# Patient Record
Sex: Female | Born: 1970 | Race: White | Hispanic: No | Marital: Married | Smoking: Former smoker
Health system: Southern US, Community
[De-identification: ages and names within clinical notes are randomized; demographics above are authoritative.]

## PROBLEM LIST (undated history)

## (undated) DIAGNOSIS — K635 Polyp of colon: Secondary | ICD-10-CM

## (undated) DIAGNOSIS — E539 Vitamin B deficiency, unspecified: Secondary | ICD-10-CM

## (undated) DIAGNOSIS — K219 Gastro-esophageal reflux disease without esophagitis: Secondary | ICD-10-CM

## (undated) DIAGNOSIS — E559 Vitamin D deficiency, unspecified: Secondary | ICD-10-CM

## (undated) DIAGNOSIS — R42 Dizziness and giddiness: Secondary | ICD-10-CM

## (undated) HISTORY — DX: Dizziness and giddiness: R42

## (undated) HISTORY — DX: Vitamin D deficiency, unspecified: E55.9

## (undated) HISTORY — DX: Gastro-esophageal reflux disease without esophagitis: K21.9

## (undated) HISTORY — DX: Polyp of colon: K63.5

## (undated) HISTORY — DX: Vitamin B deficiency, unspecified: E53.9

---
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
## 1991-12-01 HISTORY — PX: OVARIAN CYST REMOVAL: SHX89

## 1996-11-30 HISTORY — PX: HERNIA REPAIR: SHX51

## 1998-08-19 ENCOUNTER — Other Ambulatory Visit: Admission: RE | Admit: 1998-08-19 | Discharge: 1998-08-19 | Payer: Self-pay | Admitting: Obstetrics and Gynecology

## 1998-08-23 ENCOUNTER — Ambulatory Visit (HOSPITAL_BASED_OUTPATIENT_CLINIC_OR_DEPARTMENT_OTHER): Admission: RE | Admit: 1998-08-23 | Discharge: 1998-08-23 | Payer: Self-pay | Admitting: Surgery

## 1999-10-08 ENCOUNTER — Other Ambulatory Visit: Admission: RE | Admit: 1999-10-08 | Discharge: 1999-10-08 | Payer: Self-pay | Admitting: Obstetrics and Gynecology

## 2000-12-17 ENCOUNTER — Other Ambulatory Visit: Admission: RE | Admit: 2000-12-17 | Discharge: 2000-12-17 | Payer: Self-pay | Admitting: Obstetrics and Gynecology

## 2001-08-26 ENCOUNTER — Encounter: Payer: Self-pay | Admitting: Emergency Medicine

## 2001-08-27 ENCOUNTER — Inpatient Hospital Stay (HOSPITAL_COMMUNITY): Admission: AD | Admit: 2001-08-27 | Discharge: 2001-08-28 | Payer: Self-pay | Admitting: Obstetrics and Gynecology

## 2001-08-27 ENCOUNTER — Encounter: Payer: Self-pay | Admitting: Obstetrics and Gynecology

## 2001-08-29 ENCOUNTER — Encounter: Payer: Self-pay | Admitting: Obstetrics and Gynecology

## 2001-08-29 ENCOUNTER — Encounter (INDEPENDENT_AMBULATORY_CARE_PROVIDER_SITE_OTHER): Payer: Self-pay | Admitting: *Deleted

## 2001-08-29 ENCOUNTER — Inpatient Hospital Stay (HOSPITAL_COMMUNITY): Admission: AD | Admit: 2001-08-29 | Discharge: 2001-09-01 | Payer: Self-pay | Admitting: Obstetrics and Gynecology

## 2001-08-31 ENCOUNTER — Encounter: Payer: Self-pay | Admitting: *Deleted

## 2001-10-05 ENCOUNTER — Other Ambulatory Visit: Admission: RE | Admit: 2001-10-05 | Discharge: 2001-10-05 | Payer: Self-pay | Admitting: Obstetrics and Gynecology

## 2003-06-13 ENCOUNTER — Other Ambulatory Visit: Admission: RE | Admit: 2003-06-13 | Discharge: 2003-06-13 | Payer: Self-pay | Admitting: Obstetrics and Gynecology

## 2003-07-20 ENCOUNTER — Encounter: Payer: Self-pay | Admitting: Obstetrics and Gynecology

## 2003-07-20 ENCOUNTER — Encounter: Admission: RE | Admit: 2003-07-20 | Discharge: 2003-07-20 | Payer: Self-pay | Admitting: Obstetrics and Gynecology

## 2004-03-28 ENCOUNTER — Emergency Department (HOSPITAL_COMMUNITY): Admission: EM | Admit: 2004-03-28 | Discharge: 2004-03-28 | Payer: Self-pay | Admitting: Family Medicine

## 2004-07-02 ENCOUNTER — Other Ambulatory Visit: Admission: RE | Admit: 2004-07-02 | Discharge: 2004-07-02 | Payer: Self-pay | Admitting: Obstetrics and Gynecology

## 2005-05-30 ENCOUNTER — Emergency Department (HOSPITAL_COMMUNITY): Admission: EM | Admit: 2005-05-30 | Discharge: 2005-05-31 | Payer: Self-pay | Admitting: Emergency Medicine

## 2007-04-15 ENCOUNTER — Encounter: Admission: RE | Admit: 2007-04-15 | Discharge: 2007-04-15 | Payer: Self-pay | Admitting: Obstetrics and Gynecology

## 2008-02-05 ENCOUNTER — Emergency Department (HOSPITAL_COMMUNITY): Admission: EM | Admit: 2008-02-05 | Discharge: 2008-02-05 | Payer: Self-pay | Admitting: Family Medicine

## 2009-01-09 ENCOUNTER — Emergency Department (HOSPITAL_COMMUNITY): Admission: EM | Admit: 2009-01-09 | Discharge: 2009-01-09 | Payer: Self-pay | Admitting: Family Medicine

## 2009-02-20 ENCOUNTER — Emergency Department (HOSPITAL_COMMUNITY): Admission: EM | Admit: 2009-02-20 | Discharge: 2009-02-20 | Payer: Self-pay | Admitting: Family Medicine

## 2010-11-30 HISTORY — PX: ABDOMINAL HYSTERECTOMY: SHX81

## 2011-01-30 ENCOUNTER — Encounter (HOSPITAL_COMMUNITY)
Admission: RE | Admit: 2011-01-30 | Discharge: 2011-01-30 | Disposition: A | Discharge status: Home / Self Care | Payer: BC Managed Care – PPO | Source: Ambulatory Visit | Attending: Obstetrics and Gynecology | Admitting: Obstetrics and Gynecology

## 2011-01-30 LAB — CBC
HCT: 38.5 % (ref 36.0–46.0)
MCH: 29.8 pg (ref 26.0–34.0)
MCV: 87.7 fL (ref 78.0–100.0)
Platelets: 176 10*3/uL (ref 150–400)
RBC: 4.39 MIL/uL (ref 3.87–5.11)

## 2011-02-04 ENCOUNTER — Ambulatory Visit (HOSPITAL_COMMUNITY)
Admission: RE | Admit: 2011-02-04 | Discharge: 2011-02-05 | Disposition: A | Discharge status: Home / Self Care | Payer: BC Managed Care – PPO | Source: Ambulatory Visit | Attending: Urology | Admitting: Urology

## 2011-02-04 ENCOUNTER — Other Ambulatory Visit: Payer: Self-pay | Admitting: Obstetrics and Gynecology

## 2011-02-04 DIAGNOSIS — N8000 Endometriosis of the uterus, unspecified: Secondary | ICD-10-CM | POA: Insufficient documentation

## 2011-02-04 DIAGNOSIS — IMO0002 Reserved for concepts with insufficient information to code with codable children: Secondary | ICD-10-CM | POA: Insufficient documentation

## 2011-02-04 DIAGNOSIS — I889 Nonspecific lymphadenitis, unspecified: Secondary | ICD-10-CM | POA: Insufficient documentation

## 2011-02-04 DIAGNOSIS — D251 Intramural leiomyoma of uterus: Secondary | ICD-10-CM | POA: Insufficient documentation

## 2011-02-04 DIAGNOSIS — N949 Unspecified condition associated with female genital organs and menstrual cycle: Secondary | ICD-10-CM | POA: Insufficient documentation

## 2011-02-04 DIAGNOSIS — Z01818 Encounter for other preprocedural examination: Secondary | ICD-10-CM | POA: Insufficient documentation

## 2011-02-04 DIAGNOSIS — N920 Excessive and frequent menstruation with regular cycle: Secondary | ICD-10-CM | POA: Insufficient documentation

## 2011-02-04 DIAGNOSIS — N736 Female pelvic peritoneal adhesions (postinfective): Secondary | ICD-10-CM | POA: Insufficient documentation

## 2011-02-05 LAB — CBC
HCT: 30.1 % — ABNORMAL LOW (ref 36.0–46.0)
Hemoglobin: 9.8 g/dL — ABNORMAL LOW (ref 12.0–15.0)
MCV: 88.8 fL (ref 78.0–100.0)
RDW: 12.8 % (ref 11.5–15.5)
WBC: 7.7 10*3/uL (ref 4.0–10.5)

## 2011-02-12 NOTE — Op Note (Signed)
  NAME:  Brittney Riley, Brittney Riley            ACCOUNT NO.:  [id number]  MEDICAL RECORD NO.:  [id number]           PATIENT TYPE:  O  LOCATION:  [facility]                          [facility]  PHYSICIAN:  Martina Sinner, MD DATE OF BIRTH:  November 15, 1971  DATE OF PROCEDURE: DATE OF DISCHARGE:                              OPERATIVE REPORT   PREOPERATIVE DIAGNOSIS:  Pelvic pain.  POSTOPERATIVE DIAGNOSIS:  Interstitial cystitis.  SURGEON:  Martina Sinner, M.D.  PROCEDURE:  Cystoscopy, bladder hydrodistention, bladder instillation therapy.  INDICATIONS FOR PROCEDURE:  [name] has chronic pelvic pain.  PROCEDURE IN DETAIL:  Prior to her hysterectomy she underwent a hydrodistention.  A 21-French scope was utilized.  Bladder mucosa and trigone were normal.  There was no stitch, foreign body or carcinoma. She was hydrodistended only to 500 cc.  Her bladder was emptied.  On re- examination, she had diffuse glomerulations of mild degree, especially at 5 o'clock  and 7 o'clock.  The bladder was emptied.  As a separate procedure, I inserted a red rubber catheter and instilled 15 cc of 0.5% Marcaine plus 400 mg of peridium.  The patient will undergo a hysterectomy.  She will be treated for interstitial cystitis.          ______________________________ Martina Sinner, MD     SAM/MEDQ  D:  02/04/2011  T:  02/04/2011  Job:  [id number]  Electronically Signed by Alfredo Martinez MD on 02/12/2011 12:50:20 PM

## 2011-02-21 NOTE — H&P (Signed)
NAME:  Brittney Riley, Brittney Riley NO.:  [id number]  MEDICAL RECORD NO.:  [id number]           PATIENT TYPE:  O  LOCATION:  9312                          [facility]ILITY:  [facility]  PHYSICIAN:  Juluis Mire, M.D.   DATE OF BIRTH:  1971-08-09  DATE OF ADMISSION:  02/04/2011 DATE OF DISCHARGE:                             HISTORY & PHYSICAL   The patient is a 40 year old, gravida 2, para 2 female, presents for laparoscopic-assisted vaginal hysterectomy as well as perineoplasty.  The patient has been having trouble with increasing menorrhagia, dysmenorrhea, and dyspareunia.  We did an ultrasound evaluation highly suggestive of adenomyosis.  Because of concerns of interstitial cystitis, she underwent urological workup and planned to do cystoscopy before the above-noted surgery with Dr. Sherron Monday.  With also evaluated the perineum, she does have insertional dyspareunia, evaluation has revealed evidence of vestibular adenitis.  We tried treated with topical steroids and other agents without response though she therefore going to proceed with perineoplasty at the same time.  We have discussed other alternatives for management of menorrhagia, dysmenorrhea, and dyspareunia.  This can include use of birth control pills versus IUD versus ablative techniques.  ALLERGIES:  In terms of allergies, the patient reports that she is allergic to Darvocet as well as doxycycline.  MEDICATIONS:  At the present time are Zoloft 100 mg daily.  PAST MEDICAL HISTORY:  Usual childhood diseases.  No significant sequelae.  Does have a history of depression, on medications as noted.  PAST SURGICAL HISTORY:  She has had 2 prior cesarean sections.  In 1991, she had a right ovarian cyst removed.  In 1986, she had an appendectomy. In 1975, she had a tonsillectomy.  SOCIAL HISTORY:  Reveals no tobacco or alcohol use at the present time.  FAMILY HISTORY:  Significant for history of breast cancer as well  as diabetes.  REVIEW OF SYSTEMS:  Noncontributory.  PHYSICAL EXAMINATION:  VITAL SIGNS:  The patient is afebrile.  Stable vital signs. HEENT:  The patient is normocephalic.  Pupils are equal, round, and reactive to light accommodation.  Extraocular movements were intact. Sclerae and conjunctivae are clear.  Oropharynx is clear. NECK:  Without thyromegaly. BREASTS:  No discrete masses are noted. LUNGS:  Clear. CARDIOVASCULAR:  Regular rhythm and rate without murmurs or gallops. ABDOMEN:  Benign.  No masses, organomegaly, or tenderness. PELVIC:  Normal external genitalia.  Does have evidence of vestibular adenitis.  Has small reddened areas just distal to the hymenal remnant, touch with a Q-tip does reproduce the pain that she has with intercourse.  Vaginal mucosa is otherwise clear.  Cervix unremarkable. Uterus, upper limits of normal size, irregular consistent with adenomyosis.  Adnexa unremarkable.  IMPRESSION: 1. Menorrhagia, dysmenorrhea, and dyspareunia secondary to     adenomyosis. 2. Vestibular adenitis. 3. Rule-out interstitial cystitis.  PLAN AND MANAGEMENT:  The patient undergo LAVH with perineoplasty.  Risk of surgery have been discussed including the risk of infection.  The risk of hemorrhage that could require transfusion with the risk of AIDS or hepatitis.  Risk of injury to adjacent organs including bladder, bowel, ureters that could require further exploratory surgery.  Risk of deep venous thrombosis and pulmonary embolus with perineoplasty. Discussed with continued pain with intercourse despite surgical management.  Also, the risk of hematoma formation as well as infection. Again, alternatives have been explained.  The patient does wish to proceed with the above-noted surgery and Dr. Sherron Monday will be doing cystoscopy.     Juluis Mire, M.D.     JSM/MEDQ  D:  02/04/2011  T:  02/04/2011  Job:  [id number]  Electronically Signed by Richardean Chimera M.D. on  02/21/2011 06:04:18 AM

## 2011-02-21 NOTE — Op Note (Signed)
NAME:  Brittney Riley, CATALINA NO.:  [id number]  MEDICAL RECORD NO.:  [id number]           PATIENT TYPE:  O  LOCATION:  9312                          [facility]  [facility]  PHYSICIAN:  Juluis Mire, M.D.   DATE OF BIRTH:  1971-02-04  DATE OF PROCEDURE: DATE OF DISCHARGE:                              OPERATIVE REPORT   PREOPERATIVE DIAGNOSES:  Menorrhagia, pelvic pain, and dyspareunia secondary to uterine adenomyosis as well as vestibular adenitis.  POSTOPERATIVE DIAGNOSES:  Menorrhagia, pelvic pain, and dyspareunia secondary to uterine adenomyosis as well as vestibular adenitis with extensive pelvic adhesions as well as omental adhesions.  OPERATIVE PROCEDURE:  Laparoscopy, extensive lysis of adhesions with laparoscopic-assisted vaginal hysterectomy, perineoplasty, cystoscopy.  SURGEON:  Juluis Mire, MD.  ASSISTANTFreddy Finner, MD.  ANESTHESIA:  General endotracheal.  ESTIMATED BLOOD LOSS:  3-400 mL.  PACKS:  None.  DRAINS:  Included urethral Foley.  INTRAOPERATIVE BLOOD PLACED:  None.  COMPLICATIONS:  None.  INDICATIONS:  Dictated in the history and physical.  DESCRIPTION OF PROCEDURE:  The patient was taken to the OR, placed in supine position.  After satisfactory level of general anesthesia was obtained, the patient was placed in dorsal lithotomy position.  The abdomen, perineum, and vagina were prepped out with Betadine and draped into sterile field.  Dr. Sherron Monday came and did cystoscopy.  After he had completed that, a Hulka tenaculum was put in place.  Subumbilical incision was made with a knife and carried through the subcutaneous tissue.  Anterior rectus fascia was entered sharply and incision was fashioned laterally.  The peritoneum was entered with blunt finger pressure.  A open laparoscopic trocar was put in place.  The abdomen was insufflated with carbon dioxide.  The laparoscope was introduced.  She had an extensive omental adhesions  to the anterior abdominal wall.  We were able to put a 5-mm trocar in the left lower quadrant under direct visualization.  Using the Enseal, the omental adhesions were taken down to the anterior abdominal wall to the point where we could see the uterus as well as tubes and ovaries.  The uterus was densely adherent to the anterior abdominal wall.  Continuing to use the Enseal, we were able to separate the anterior part of the uterine fundus from the anterior abdominal wall.  We have continued this until we got into the lower uterine segment.  At this point in time, we had freed up the uterus fairly well.  Both tubes and ovaries were unremarkable.  Using the Enseal, both utero-ovarian pedicles were cauterized and incised, both round ligament were cauterized and incised.  At this point, we had good freeing of the uterus, fairly good hemostasis, at this point, the plan was to go vaginally.  Laparoscope was removed.  The abdomen was deflated with carbon dioxide. The patient's legs were repositioned.  The Hulka tenaculum was then removed.  Weighted spec was placed in the vaginal vault.  The cervix was grasped with Christella Hartigan tenaculum.  Cul-de-sac was entered sharply.  Both uterosacral ligaments were clamped, cut, and suture ligated with 0 Vicryl.  The reflection of the vaginal mucosa  anteriorly was incised. The bladder was dissected superiorly.  Paracervical tissue was clamped, cut, and suture ligated with 0 Vicryl.  Using the clamp, cut, and tie technique with suture ligatures of 0 Vicryl, we serially separated the parametrium from the sides of the uterus.  We continued to dissect the bladder superiorly, never could adequately identify the vesicouterine space.  At this point in time, the uterus was flipped.  We were able then to develop the vesicouterine space, remaining pedicles were clamped and cut.  Uterus and cervix were passed off the operative field and sent to pathology.  Held pedicles,  secured with free ties of 0 Vicryl.  Next, the posterior vaginal cuff was run with a running locking suture of 2-0 Vicryl.  Vaginal mucosa was closed with interrupted sutures of 2-0 Monocryl.  We emptied the bladder, it was a yellow tinge due to the Pyridium, but there was no blood noted.  At this point in time, we turned to the perineoplasty.  The perineum and part of the vagina were infiltrated with 1% Xylocaine with epinephrine 1:200,000.  We identified the area of the vestibular glands.  On the distal side of the hymenal remnant, we began an incision at the level of clitoris, brought in posteriorly to the perineal body and up to the other side.  We then undermined the skin slightly in this rigid tissue including the vestibular glands and hymenal remnant were excised.  We then undermined the vaginal mucosa and brought out hemostasis using the Bovie.  Vaginal mucosa was reapproximated to the external perineal skin with interrupted sutures of 2-0 Rapide.  We had good reapproximation and no active bleeding.  There was no evidence of any tightness to the area, was felt like we had good re-approximation.  At this point in time, cystoscopy was performed.  The patient had been given indigo carmine.  First of all, there was no evidence of any entry into the bladder.  The bladder wall was intact.  We could see blue dye spilling from both ureteral orifices.  The cystoscope was removed. Foley was placed to straight drain.  Laparoscope was reintroduced.  We irrigated the pelvic cavity.  We had some bleeding from the vaginal cuff, brought under control with the bipolar.  Both ovaries were hemostatically intact.  We then deflated the abdomen and re-visualized.  There was no active bleeding at this point time.  No signs of injury to any organ system.  The abdomen was deflated with carbon dioxide.  All trocars were removed.  Subumbilical fascia was closed figure-of-eight of 0 Vicryl.  Skin was  closed with interrupted subcuticular of 4-0 Vicryl.  Suprapubic incision was closed with Dermabond.  Sponge, instrument, and needle count was correct by circulating nurse x2.  Foley catheter was clear at the time of closure. The patient tolerated the procedure well and returned to recovery room in good condition.     Juluis Mire, M.D.     JSM/MEDQ  D:  02/04/2011  T:  02/04/2011  Job:  [id number]  Electronically Signed by Richardean Chimera M.D. on 02/21/2011 06:04:20 AM

## 2011-02-21 NOTE — Discharge Summary (Signed)
  NAME:  Brittney Riley, Brittney Riley NO.:  [id number]  MEDICAL RECORD NO.:  [id number]           PATIENT TYPE:  O  LOCATION:  9312                          [facility]  PHYSICIAN:  Juluis Mire, M.D.   DATE OF BIRTH:  05/15/1971  DATE OF ADMISSION:  02/04/2011 DATE OF DISCHARGE:  02/05/2011                              DISCHARGE SUMMARY   ADMITTING DIAGNOSES:  Menorrhagia with associated dysmenorrhea and dyspareunia secondary to uterine adenomyosis as well as vestibular adenitis.  DISCHARGE DIAGNOSES:  Menorrhagia with associated dysmenorrhea and dyspareunia secondary to uterine adenomyosis as well as vestibular adenitis with the addition of extensive pelvic adhesions.  OPERATIVE PROCEDURE:  Laparoscopic-assisted vaginal hysterectomy with perineoplasty and cystoscopy.  For history and physical, see dictated note.  COURSE IN THE HOSPITAL:  The patient underwent above-noted surgery.  In addition, Dr. Sherron Monday did perform cystoscopy.  She did have extensive abdominopelvic adhesions.  We successfully were able to do a laparoscopic-assisted vaginal hysterectomy.  Ovaries looked normal, were left in place.  Perineoplasty was also performed.  Postop did well. Postop day #1, she was afebrile, stable vital signs.  Abdomen was soft. Bowel sounds were active.  Incisions were all clear.  She had minimal vaginal bleeding.  Hemoglobin was 9.8.  She will be discharged home at this time.  In terms of complications, none encountered during stay in the hospital. The patient discharged home in stable condition.  DISPOSITION:  Routine postop instructions given.  She is to avoid heavy lifting, vaginal entrance, or driving a car.  Discharged home on Percocet as she needs for pain.  She is instructed to call should there be signs of active vaginal bleeding, fever, nausea, vomiting, excessive pain.  Also instructed on signs and symptoms of deep venous thrombosis and pulmonary  embolus.     Juluis Mire, M.D.     JSM/MEDQ  D:  02/05/2011  T:  02/05/2011  Job:  [id number]  Electronically Signed by Richardean Chimera M.D. on 02/21/2011 06:04:16 AM

## 2011-03-17 LAB — DIFFERENTIAL
Eosinophils Absolute: 0 10*3/uL (ref 0.0–0.7)
Lymphs Abs: 1.5 10*3/uL (ref 0.7–4.0)
Monocytes Relative: 5 % (ref 3–12)
Neutro Abs: 3.5 10*3/uL (ref 1.7–7.7)
Neutrophils Relative %: 66 % (ref 43–77)

## 2011-03-17 LAB — CBC
HCT: 37.7 % (ref 36.0–46.0)
Hemoglobin: 13.2 g/dL (ref 12.0–15.0)
MCHC: 35 g/dL (ref 30.0–36.0)
MCV: 89.1 fL (ref 78.0–100.0)
RBC: 4.23 MIL/uL (ref 3.87–5.11)
RDW: 13 % (ref 11.5–15.5)

## 2011-03-17 LAB — COMPREHENSIVE METABOLIC PANEL
BUN: 18 mg/dL (ref 6–23)
CO2: 24 mEq/L (ref 19–32)
Calcium: 8.9 mg/dL (ref 8.4–10.5)
Creatinine, Ser: 0.73 mg/dL (ref 0.4–1.2)
GFR calc non Af Amer: >60 mL/min (ref >60)
Glucose, Bld: 91 mg/dL (ref 70–99)
Sodium: 134 mEq/L — ABNORMAL LOW (ref 135–145)
Total Protein: 6.9 g/dL (ref 6.0–8.3)

## 2011-03-17 LAB — URINALYSIS, ROUTINE W REFLEX MICROSCOPIC
Glucose, UA: NEGATIVE mg/dL
Leukocytes, UA: NEGATIVE
Protein, ur: NEGATIVE mg/dL
Specific Gravity, Urine: 1.03 (ref 1.005–1.030)
Urobilinogen, UA: 0.2 mg/dL (ref 0.0–1.0)

## 2011-03-17 LAB — URINE MICROSCOPIC-ADD ON
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
## 2011-06-02 ENCOUNTER — Inpatient Hospital Stay (INDEPENDENT_AMBULATORY_CARE_PROVIDER_SITE_OTHER)
Admission: RE | Admit: 2011-06-02 | Discharge: 2011-06-02 | Disposition: A | Discharge status: Home / Self Care | Payer: BC Managed Care – PPO | Source: Ambulatory Visit | Attending: Emergency Medicine | Admitting: Emergency Medicine

## 2011-06-02 DIAGNOSIS — H81399 Other peripheral vertigo, unspecified ear: Secondary | ICD-10-CM

## 2012-01-04 ENCOUNTER — Encounter (HOSPITAL_COMMUNITY): Payer: Self-pay

## 2012-01-04 ENCOUNTER — Emergency Department (INDEPENDENT_AMBULATORY_CARE_PROVIDER_SITE_OTHER)
Admission: EM | Admit: 2012-01-04 | Discharge: 2012-01-04 | Disposition: A | Discharge status: Home / Self Care | Payer: BC Managed Care – PPO | Source: Home / Self Care | Attending: Family Medicine | Admitting: Family Medicine

## 2012-01-04 ENCOUNTER — Emergency Department (INDEPENDENT_AMBULATORY_CARE_PROVIDER_SITE_OTHER): Payer: BC Managed Care – PPO

## 2012-01-04 DIAGNOSIS — M5090 Cervical disc disorder, unspecified, unspecified cervical region: Secondary | ICD-10-CM

## 2012-01-04 DIAGNOSIS — M5092 Unspecified cervical disc disorder, mid-cervical region, unspecified level: Secondary | ICD-10-CM

## 2012-01-04 MED ORDER — KETOROLAC TROMETHAMINE 30 MG/ML IJ SOLN
30.0000 mg | Freq: Once | INTRAMUSCULAR | Status: AC
  Administered 2012-01-04: 30 mg via INTRAMUSCULAR

## 2012-01-04 MED ORDER — KETOROLAC TROMETHAMINE 30 MG/ML IJ SOLN
INTRAMUSCULAR | Status: AC
  Filled 2012-01-04: qty 1

## 2012-01-04 MED ORDER — METHYLPREDNISOLONE 4 MG PO KIT: PACK | ORAL | Status: AC

## 2012-01-04 NOTE — ED Provider Notes (Signed)
History     CSN: [id number]  Arrival date & time 01/04/12  1758   First MD Initiated Contact with Patient 01/04/12 1921      Chief Complaint  Patient presents with  . Arm Pain    (Consider location/radiation/quality/duration/timing/severity/associated sxs/prior treatment) Patient is a 41 y.o. female presenting with arm pain. The history is provided by the patient.  Arm Pain This is a new problem. The current episode started more than 1 week ago. The problem occurs constantly. The problem has been gradually worsening (sooting tingling pain down left arm to fingers esp thumb). Pertinent negatives include no chest pain and no shortness of breath. Associated symptoms comments: Twin sister with ddd neck..    History reviewed. No pertinent past medical history.  Past Surgical History  Procedure Date  . Cesarean section   . Abdominal hysterectomy     No family history on file.  History  Substance Use Topics  . Smoking status: Never Smoker   . Smokeless tobacco: Not on file  . Alcohol Use: No    OB History    Grav Para Term Preterm Abortions TAB SAB Ect Mult Living                  Review of Systems  Constitutional: Negative.   Respiratory: Negative for shortness of breath.   Cardiovascular: Negative for chest pain.  Neurological: Positive for numbness.    Allergies  Doxycycline  Home Medications   Current Outpatient Rx  Name Route Sig Dispense Refill  . METHYLPREDNISOLONE 4 MG PO KIT  follow package directions, start on tues until finished. 21 tablet 0    BP 159/78  Pulse 82  Temp(Src) 98.1 F (36.7 C) (Oral)  Resp 20  SpO2 99%  Physical Exam  Constitutional: She is oriented to person, place, and time. She appears well-developed and well-nourished.  HENT:  Head: Normocephalic.  Neck: Normal range of motion. Neck supple. No thyromegaly present.  Musculoskeletal:       Back:  Lymphadenopathy:    She has no cervical adenopathy.  Neurological: She is  alert and oriented to person, place, and time.  Skin: Skin is warm and dry.    ED Course  Procedures (including critical care time)  Labs Reviewed - No data to display Dg Cervical Spine Complete  01/04/2012  *RADIOLOGY REPORT*  Clinical Data: Left arm numbness and tingling  CERVICAL SPINE - COMPLETE 4+ VIEW  Comparison: None.  Findings: Mild narrowing of the C5-6 interspace with small anterior endplate spurs.  Normal alignment.  No prevertebral soft tissue swelling.  No other significant osseous degenerative change. Negative for fracture.  Normal alignment.  IMPRESSION:  1.  Negative for fracture or other acute abnormality. 2.  Early narrowing of the C5-6 interspace.  Original Report Authenticated By: Osa Craver, M.D.     1. Cervical disc disorder of mid-cervical region       MDM  X-rays reviewed and report per radiologist.         Barkley Bruns, MD 01/04/12 2010

## 2012-01-04 NOTE — ED Notes (Signed)
C/o lt arm aching for 2 weeks. States also having shooting pains down into her hand with some intermittent tingling /numbness to lt fingertips.  States at times it hurts in her upper back , lt side of neck and lt shoulder.  Denies injury or strenuous activity.  Denies chest pain or SOB.  Arm is not tender to touch, pain is not worsened with movement but states if she holds it up in the air, it feels better.

## 2012-05-16 ENCOUNTER — Other Ambulatory Visit: Payer: Self-pay | Admitting: Obstetrics and Gynecology

## 2012-05-16 DIAGNOSIS — R928 Other abnormal and inconclusive findings on diagnostic imaging of breast: Secondary | ICD-10-CM

## 2012-05-20 ENCOUNTER — Ambulatory Visit
Admission: RE | Admit: 2012-05-20 | Discharge: 2012-05-20 | Disposition: A | Discharge status: Home / Self Care | Payer: BC Managed Care – PPO | Source: Ambulatory Visit | Attending: Obstetrics and Gynecology | Admitting: Obstetrics and Gynecology

## 2012-05-20 DIAGNOSIS — R928 Other abnormal and inconclusive findings on diagnostic imaging of breast: Secondary | ICD-10-CM

## 2012-06-15 DIAGNOSIS — M5030 Other cervical disc degeneration, unspecified cervical region: Secondary | ICD-10-CM | POA: Insufficient documentation

## 2012-11-16 ENCOUNTER — Other Ambulatory Visit: Payer: Self-pay | Admitting: Obstetrics and Gynecology

## 2012-11-16 DIAGNOSIS — R921 Mammographic calcification found on diagnostic imaging of breast: Secondary | ICD-10-CM

## 2012-11-25 ENCOUNTER — Ambulatory Visit
Admission: RE | Admit: 2012-11-25 | Discharge: 2012-11-25 | Disposition: A | Discharge status: Home / Self Care | Payer: BC Managed Care – PPO | Source: Ambulatory Visit | Attending: Obstetrics and Gynecology | Admitting: Obstetrics and Gynecology

## 2012-11-25 DIAGNOSIS — R921 Mammographic calcification found on diagnostic imaging of breast: Secondary | ICD-10-CM

## 2013-07-04 ENCOUNTER — Other Ambulatory Visit: Payer: Self-pay | Admitting: Obstetrics and Gynecology

## 2013-07-04 DIAGNOSIS — R921 Mammographic calcification found on diagnostic imaging of breast: Secondary | ICD-10-CM

## 2013-07-17 ENCOUNTER — Ambulatory Visit
Admission: RE | Admit: 2013-07-17 | Discharge: 2013-07-17 | Disposition: A | Discharge status: Home / Self Care | Payer: BC Managed Care – PPO | Source: Ambulatory Visit | Attending: Obstetrics and Gynecology | Admitting: Obstetrics and Gynecology

## 2013-07-17 DIAGNOSIS — R921 Mammographic calcification found on diagnostic imaging of breast: Secondary | ICD-10-CM

## 2013-07-18 ENCOUNTER — Emergency Department (INDEPENDENT_AMBULATORY_CARE_PROVIDER_SITE_OTHER): Payer: BC Managed Care – PPO

## 2013-07-18 ENCOUNTER — Emergency Department (HOSPITAL_COMMUNITY)
Admission: EM | Admit: 2013-07-18 | Discharge: 2013-07-18 | Disposition: A | Discharge status: Home / Self Care | Payer: BC Managed Care – PPO | Source: Home / Self Care

## 2013-07-18 ENCOUNTER — Encounter (HOSPITAL_COMMUNITY): Payer: Self-pay | Admitting: Emergency Medicine

## 2013-07-18 DIAGNOSIS — M654 Radial styloid tenosynovitis [de Quervain]: Secondary | ICD-10-CM

## 2013-07-18 MED ORDER — TRAMADOL HCL 50 MG PO TABS: 50.0000 mg | ORAL_TABLET | Freq: Four times a day (QID) | ORAL | Status: DC | PRN

## 2013-07-18 MED ORDER — DICLOFENAC EPOLAMINE 1.3 % TD PTCH: 1.0000 | MEDICATED_PATCH | Freq: Two times a day (BID) | TRANSDERMAL | Status: DC | PRN

## 2013-07-18 MED ORDER — IBUPROFEN 800 MG PO TABS
ORAL_TABLET | ORAL | Status: AC
  Filled 2013-07-18: qty 1

## 2013-07-18 MED ORDER — IBUPROFEN 800 MG PO TABS
800.0000 mg | ORAL_TABLET | Freq: Once | ORAL | Status: AC
  Administered 2013-07-18: 800 mg via ORAL

## 2013-07-18 NOTE — ED Notes (Signed)
C/o injury to wrist two weeks ago. States that while making bed she applied all weight to right wrist. Unable to lift or hold objects.  Pt is c/o of pain in wrist that is a throbbing sensation. Pt has used brace with mild relief.  States it hurts worse today.

## 2013-07-18 NOTE — ED Provider Notes (Signed)
CSN: [id number]     Arrival date & time 07/18/13  1221 History     First MD Initiated Contact with Patient 07/18/13 1251     Chief Complaint  Patient presents with  . Wrist Injury    in to right wrist while making bed 2 weeks ago   (Consider location/radiation/quality/duration/timing/severity/associated sxs/prior Treatment) HPI Comments: 42 year old female presents complaining of right wrist pain. This pain has been intermittent and mild only issue works at the past few years. However, in the past 2 weeks it has become severe. This began after she put her weight down on her right hand while making a bed 2 weeks ago. She has constant throbbing pain around the base of her thumb that radiates across the hand and up the arm. The pain is exacerbated by any gripping or movements involving her fingers. She states is that she cannot even hold a coffee cup or her cell phone. She has been wearing a brace which seems to help slightly but the pain is still very bothersome,. She denies swelling, redness, or numbness in the fingers.   History reviewed. No pertinent past medical history. Past Surgical History  Procedure Laterality Date  . Cesarean section    . Abdominal hysterectomy     History reviewed. No pertinent family history. History  Substance Use Topics  . Smoking status: Never Smoker   . Smokeless tobacco: Not on file  . Alcohol Use: No   OB History   Grav Para Term Preterm Abortions TAB SAB Ect Mult Living                 Review of Systems  Constitutional: Negative for fever and chills.  Eyes: Negative for visual disturbance.  Respiratory: Negative for cough and shortness of breath.   Cardiovascular: Negative for chest pain, palpitations and leg swelling.  Gastrointestinal: Negative for nausea, vomiting and abdominal pain.  Endocrine: Negative for polydipsia and polyuria.  Genitourinary: Negative for dysuria, urgency and frequency.  Musculoskeletal: Positive for arthralgias (see  history of present illness). Negative for myalgias.  Skin: Negative for rash.  Neurological: Negative for dizziness, weakness and light-headedness.    Allergies  Doxycycline  Home Medications   Current Outpatient Rx  Name  Route  Sig  Dispense  Refill  . diclofenac (FLECTOR) 1.3 % PTCH   Transdermal   Place 1 patch onto the skin every 12 (twelve) hours as needed.   30 patch   0   . traMADol (ULTRAM) 50 MG tablet   Oral   Take 1 tablet (50 mg total) by mouth every 6 (six) hours as needed for pain.   20 tablet   0    BP 136/79  Pulse 72  Temp(Src) 98.6 F (37 C) (Oral)  Resp 18  SpO2 98% Physical Exam  Nursing note and vitals reviewed. Constitutional: She is oriented to person, place, and time. She appears well-developed and well-nourished. No distress.  HENT:  Head: Normocephalic and atraumatic.  Musculoskeletal:       Right wrist: She exhibits decreased range of motion and tenderness (at the anterior base of the thumb ). She exhibits no swelling, no effusion, no crepitus and no deformity.  Decreased grip strength on the right   Neurological: She is alert and oriented to person, place, and time. She displays normal reflexes. No cranial nerve deficit. She exhibits normal muscle tone. Coordination normal.  Skin: Skin is warm and dry. No rash noted. She is not diaphoretic.  Psychiatric: She has a  normal mood and affect. Judgment normal.    ED Course   Procedures (including critical care time)  Labs Reviewed - No data to display Dg Wrist Complete Right  07/18/2013   *RADIOLOGY REPORT*  Clinical Data: Pain post trauma  RIGHT WRIST - COMPLETE 3+ VIEW  Comparison: None.  Findings: Frontal, oblique, lateral, and ulnar deviation scaphoid images were obtained.  There is no fracture or dislocation.  Joint spaces appear intact.  No erosive change.  IMPRESSION: No abnormality noted.   Original Report Authenticated By: Bretta Bang, M.D.   Mm Digital Diagnostic  Bilat  07/17/2013   *RADIOLOGY REPORT*  Clinical Data:  Patient presents for a follow-up diagnostic left breast evaluation to assess microcalcifications.  DIGITAL DIAGNOSTIC BILATERAL MAMMOGRAM WITH CAD  Comparison: 11/25/2012, 05/09/2012, 11/04/2010 and 04/15/2007  Findings:  ACR Breast Density Category c:  The breast tissue is heterogeneously dense, which may obscure small masses.  Exam demonstrates no change in a group of microcalcifications over the outer midportion of the left breast.  Some of these layer on the lateral image while others are less well defined on the CC image and better defined on the MLO images.  Findings suggest that these represent milk of calcium.  Remainder of the exam is unchanged.  Mammographic images were processed with CAD.  IMPRESSION: Stable microcalcifications over the outer central left breast likely benign and likely representing fibrocystic change/milk of calcium.  RECOMMENDATION: Recommend an additional follow-up diagnostic left breast mammogram with magnification views in 1 year to document 2 years of stability at the time of patient's annual exam and August 2015.  I have discussed the findings and recommendations with the patient. Results were also provided in writing at the conclusion of the visit.  If applicable, a reminder letter will be sent to the patient regarding her next appointment.  BI-RADS CATEGORY 2:  Benign finding(s).   Original Report Authenticated By: Elberta Fortis, M.D.   1. De Quervain's tenosynovitis, right     MDM  No radiographic evidence of any fracture. Placing in a thumb spica splint and a flexor patch. Given 800 mg ibuprofen here. Followup with sports medicine   Meds ordered this encounter  Medications  . ibuprofen (ADVIL,MOTRIN) tablet 800 mg    Sig:   . diclofenac (FLECTOR) 1.3 % PTCH    Sig: Place 1 patch onto the skin every 12 (twelve) hours as needed.    Dispense:  30 patch    Refill:  0  . traMADol (ULTRAM) 50 MG tablet    Sig:  Take 1 tablet (50 mg total) by mouth every 6 (six) hours as needed for pain.    Dispense:  20 tablet    Refill:  0     Graylon Good, PA-C 07/18/13 1422

## 2013-07-20 NOTE — ED Provider Notes (Signed)
Medical screening examination/treatment/procedure(s) were performed by a resident physician or non-physician practitioner and as the supervising physician I was immediately available for consultation/collaboration.  Clementeen Graham, MD   Rodolph Bong, MD 07/20/13 [phone]

## 2013-09-25 ENCOUNTER — Other Ambulatory Visit: Payer: Self-pay | Admitting: Physician Assistant

## 2014-02-18 ENCOUNTER — Emergency Department (INDEPENDENT_AMBULATORY_CARE_PROVIDER_SITE_OTHER): Payer: BC Managed Care – PPO

## 2014-02-18 ENCOUNTER — Encounter (HOSPITAL_COMMUNITY): Payer: Self-pay | Admitting: Emergency Medicine

## 2014-02-18 ENCOUNTER — Emergency Department (HOSPITAL_COMMUNITY)
Admission: EM | Admit: 2014-02-18 | Discharge: 2014-02-18 | Disposition: A | Discharge status: Home / Self Care | Payer: BC Managed Care – PPO | Source: Home / Self Care | Attending: Emergency Medicine | Admitting: Emergency Medicine

## 2014-02-18 DIAGNOSIS — J40 Bronchitis, not specified as acute or chronic: Secondary | ICD-10-CM

## 2014-02-18 DIAGNOSIS — M549 Dorsalgia, unspecified: Secondary | ICD-10-CM

## 2014-02-18 MED ORDER — HYDROCODONE-ACETAMINOPHEN 5-325 MG PO TABS
2.0000 | ORAL_TABLET | Freq: Once | ORAL | Status: AC
  Administered 2014-02-18: 2 via ORAL

## 2014-02-18 MED ORDER — AZITHROMYCIN 250 MG PO TABS: ORAL_TABLET | ORAL | Status: DC

## 2014-02-18 MED ORDER — HYDROCODONE-ACETAMINOPHEN 5-325 MG PO TABS
ORAL_TABLET | ORAL | Status: AC
  Filled 2014-02-18: qty 2

## 2014-02-18 MED ORDER — HYDROCODONE-ACETAMINOPHEN 5-325 MG PO TABS: 1.0000 | ORAL_TABLET | Freq: Four times a day (QID) | ORAL | Status: DC | PRN

## 2014-02-18 MED ORDER — ALBUTEROL SULFATE HFA 108 (90 BASE) MCG/ACT IN AERS: 2.0000 | INHALATION_SPRAY | RESPIRATORY_TRACT | Status: DC | PRN

## 2014-02-18 MED ORDER — METHYLPREDNISOLONE 4 MG PO KIT: PACK | ORAL | Status: DC

## 2014-02-18 NOTE — ED Notes (Signed)
Onset 3/10 of cold symptoms: cough, sneezing, watery eyes.  Now cough has moved into chest and chest hurts with coughing.  Generalized aching and skin is sore to touch making it difficult for patient to get comfortable.  Patient has mid back pain and reports pain into left leg, and again just cannot get comfortable

## 2014-02-18 NOTE — ED Provider Notes (Signed)
Medical screening examination/treatment/procedure(s) were performed by non-physician practitioner and as supervising physician I was immediately available for consultation/collaboration.  Philipp Deputy, M.D.  Harden Mo, MD 02/18/14 [phone]

## 2014-02-18 NOTE — Discharge Instructions (Signed)
Antibiotic Nonuse  Your caregiver felt that the infection or problem was not one that would be helped with an antibiotic. Infections may be caused by viruses or bacteria. Only a caregiver can tell which one of these is the likely cause of an illness. A cold is the most common cause of infection in both adults and children. A cold is a virus. Antibiotic treatment will have no effect on a viral infection. Viruses can lead to many lost days of work caring for sick children and many missed days of school. Children may catch as many as 10 "colds" or "flus" per year during which they can be tearful, cranky, and uncomfortable. The goal of treating a virus is aimed at keeping the ill person comfortable. Antibiotics are medications used to help the body fight bacterial infections. There are relatively few types of bacteria that cause infections but there are hundreds of viruses. While both viruses and bacteria cause infection they are very different types of germs. A viral infection will typically go away by itself within 7 to 10 days. Bacterial infections may spread or get worse without antibiotic treatment. Examples of bacterial infections are:  Sore throats (like strep throat or tonsillitis).  Infection in the lung (pneumonia).  Ear and skin infections. Examples of viral infections are:  Colds or flus.  Most coughs and bronchitis.  Sore throats not caused by Strep.  Runny noses. It is often best not to take an antibiotic when a viral infection is the cause of the problem. Antibiotics can kill off the helpful bacteria that we have inside our body and allow harmful bacteria to start growing. Antibiotics can cause side effects such as allergies, nausea, and diarrhea without helping to improve the symptoms of the viral infection. Additionally, repeated uses of antibiotics can cause bacteria inside of our body to become resistant. That resistance can be passed onto harmful bacterial. The next time you have  an infection it may be harder to treat if antibiotics are used when they are not needed. Not treating with antibiotics allows our own immune system to develop and take care of infections more efficiently. Also, antibiotics will work better for [place] when they are prescribed for bacterial infections. Treatments for a child that is ill may include:  Give extra fluids throughout the day to stay hydrated.  Get plenty of rest.  Only give your child over-the-counter or prescription medicines for pain, discomfort, or fever as directed by your caregiver.  The use of a cool mist humidifier may help stuffy noses.  Cold medications if suggested by your caregiver. Your caregiver may decide to start you on an antibiotic if:  The problem you were seen for today continues for a longer length of time than expected.  You develop a secondary bacterial infection. SEEK MEDICAL CARE IF:  Fever lasts longer than 5 days.  Symptoms continue to get worse after 5 to 7 days or become severe.  Difficulty in breathing develops.  Signs of dehydration develop (poor drinking, rare urinating, dark colored urine).  Changes in behavior or worsening tiredness (listlessness or lethargy). Document Released: 01/25/2002 Document Revised: 02/08/2012 Document Reviewed: 07/24/2009 [facility] Patient Information 2014 [place], [place].  Bronchitis Bronchitis is inflammation of the airways that extend from the windpipe into the lungs (bronchi). The inflammation often causes mucus to develop, which leads to a cough. If the inflammation becomes severe, it may cause shortness of breath. CAUSES  Bronchitis may be caused by:   Viral infections.   Bacteria.  Cigarette smoke.   Allergens, pollutants, and other irritants.  SIGNS AND SYMPTOMS  The most common symptom of bronchitis is a frequent cough that produces mucus. Other symptoms include:  Fever.   Body aches.   Chest congestion.   Chills.   Shortness of  breath.   Sore throat.  DIAGNOSIS  Bronchitis is usually diagnosed through a medical history and physical exam. Tests, such as chest X-rays, are sometimes done to rule out other conditions.  TREATMENT  You may need to avoid contact with whatever caused the problem (smoking, for example). Medicines are sometimes needed. These may include:  Antibiotics. These may be prescribed if the condition is caused by bacteria.  Cough suppressants. These may be prescribed for relief of cough symptoms.   Inhaled medicines. These may be prescribed to help open your airways and make it easier for you to breathe.   Steroid medicines. These may be prescribed for those with recurrent (chronic) bronchitis. HOME CARE INSTRUCTIONS  Get plenty of rest.   Drink enough fluids to keep your urine clear or pale yellow (unless you have a medical condition that requires fluid restriction). Increasing fluids may help thin your secretions and will prevent dehydration.   Only take over-the-counter or prescription medicines as directed by your health care provider.  Only take antibiotics as directed. Make sure you finish them even if you start to feel better.  Avoid secondhand smoke, irritating chemicals, and strong fumes. These will make bronchitis worse. If you are a smoker, quit smoking. Consider using nicotine gum or skin patches to help control withdrawal symptoms. Quitting smoking will help your lungs heal faster.   Put a cool-mist humidifier in your bedroom at night to moisten the air. This may help loosen mucus. Change the water in the humidifier daily. You can also run the hot water in your shower and sit in the bathroom with the door closed for 5 10 minutes.   Follow up with your health care provider as directed.   Wash your hands frequently to avoid catching bronchitis again or spreading an infection to others.  SEEK MEDICAL CARE IF: Your symptoms do not improve after 1 week of treatment.  SEEK  IMMEDIATE MEDICAL CARE IF:  Your fever increases.  You have chills.   You have chest pain.   You have worsening shortness of breath.   You have bloody sputum.  You faint.  You have lightheadedness.  You have a severe headache.   You vomit repeatedly. MAKE SURE YOU:   Understand these instructions.  Will watch your condition.  Will get help right away if you are not doing well or get worse. Document Released: 11/16/2005 Document Revised: 09/06/2013 Document Reviewed: 07/11/2013 [facility] Patient Information 2014 [place].  Back Pain, Adult Low back pain is very common. About 1 in 5 people have back pain.The cause of low back pain is rarely dangerous. The pain often gets better over time.About half of people with a sudden onset of back pain feel better in just 2 weeks. About 8 in 10 people feel better by 6 weeks.  CAUSES Some common causes of back pain include:  Strain of the muscles or ligaments supporting the spine.  Wear and tear (degeneration) of the spinal discs.  Arthritis.  Direct injury to the back. DIAGNOSIS Most of the time, the direct cause of low back pain is not known.However, back pain can be treated effectively even when the exact cause of the pain is unknown.Answering your caregiver's questions about your  overall health and symptoms is one of the most accurate ways to make sure the cause of your pain is not dangerous. If your caregiver needs more information, he or she may order lab work or imaging tests (X-rays or MRIs).However, even if imaging tests show changes in your back, this usually does not require surgery. HOME CARE INSTRUCTIONS For many people, back pain returns.Since low back pain is rarely dangerous, it is often a condition that people can learn to [facility] their own.   Remain active. It is stressful on the back to sit or stand in one place. Do not sit, drive, or stand in one place for more than 30 minutes at a time. Take  short walks on level surfaces as soon as pain allows.Try to increase the length of time you walk each day.  Do not stay in bed.Resting more than 1 or 2 days can delay your recovery.  Do not avoid exercise or work.Your body is made to move.It is not dangerous to be active, even though your back may hurt.Your back will likely heal faster if you return to being active before your pain is gone.  Pay attention to your body when you bend and lift. Many people have less discomfortwhen lifting if they bend their knees, keep the load close to their bodies,and avoid twisting. Often, the most comfortable positions are those that put less stress on your recovering back.  Find a comfortable position to sleep. Use a firm mattress and lie on your side with your knees slightly bent. If you lie on your back, put a pillow under your knees.  Only take over-the-counter or prescription medicines as directed by your caregiver. Over-the-counter medicines to reduce pain and inflammation are often the most helpful.Your caregiver may prescribe muscle relaxant drugs.These medicines help dull your pain so you can more quickly return to your normal activities and healthy exercise.  Put ice on the injured area.  Put ice in a plastic bag.  Place a towel between your skin and the bag.  Leave the ice on for 15-20 minutes, 03-04 times a day for the first 2 to 3 days. After that, ice and heat may be alternated to reduce pain and spasms.  Ask your caregiver about trying back exercises and gentle massage. This may be of some benefit.  Avoid feeling anxious or stressed.Stress increases muscle tension and can worsen back pain.It is important to recognize when you are anxious or stressed and learn ways to manage it.Exercise is a great option. SEEK MEDICAL CARE IF:  You have pain that is not relieved with rest or medicine.  You have pain that does not improve in 1 week.  You have new symptoms.  You are generally  not feeling well. SEEK IMMEDIATE MEDICAL CARE IF:   You have pain that radiates from your back into your legs.  You develop new bowel or bladder control problems.  You have unusual weakness or numbness in your arms or legs.  You develop nausea or vomiting.  You develop abdominal pain.  You feel faint. Document Released: 11/16/2005 Document Revised: 05/17/2012 Document Reviewed: 04/06/2011 [facility] Patient Information 2014 [place], [place].

## 2014-02-18 NOTE — ED Provider Notes (Signed)
CSN: [id number]     Arrival date & time 02/18/14  4782 History   First MD Initiated Contact with Patient 02/18/14 [phone]     Chief Complaint  Patient presents with  . URI  . Back Pain   (Consider location/radiation/quality/duration/timing/severity/associated sxs/prior Treatment) HPI Comments: 43 year old female presents complaining of back pain, skin sensitivity, cough, sneezing, watery eyes, chest pain with coughing. Also has generalized myalgias and overall feels unwell. Her symptoms have been getting progressively worse for one week now. She started with sneezing and watery eyes. The cough started the next day. The back pain started on Friday and has gotten progressively worse, now rated as severe 8-9/10. Denies any loss of bowel or bladder control or extremity numbness. Denies shortness of breath. Denies leg swelling. Denies recent travel or sick contacts. Denies history of DVT or PE. Over-the-counter medications are not helping.   History reviewed. No pertinent past medical history. Past Surgical History  Procedure Laterality Date  . Cesarean section    . Abdominal hysterectomy     No family history on file. History  Substance Use Topics  . Smoking status: Never Smoker   . Smokeless tobacco: Not on file  . Alcohol Use: No   OB History   Grav Para Term Preterm Abortions TAB SAB Ect Mult Living                 Review of Systems  Constitutional: Negative for fever and chills.  HENT: Positive for congestion and rhinorrhea. Negative for ear pain, postnasal drip, sinus pressure and sore throat.   Eyes: Negative for visual disturbance.  Respiratory: Positive for cough and chest tightness. Negative for shortness of breath.   Cardiovascular: Positive for chest pain. Negative for palpitations and leg swelling.  Gastrointestinal: Negative for nausea, vomiting and abdominal pain.  Endocrine: Negative for polydipsia and polyuria.  Genitourinary: Negative for dysuria, urgency and  frequency.  Musculoskeletal: Positive for gait problem. Negative for arthralgias and myalgias.  Skin: Negative for rash.       Skin sensitivity of bilateral lower extremity  Neurological: Negative for dizziness, weakness and light-headedness.    Allergies  Doxycycline  Home Medications   Current Outpatient Rx  Name  Route  Sig  Dispense  Refill  . albuterol (PROVENTIL HFA;VENTOLIN HFA) 108 (90 BASE) MCG/ACT inhaler   Inhalation   Inhale 2 puffs into the lungs every 4 (four) hours as needed for wheezing.   1 Inhaler   0   . azithromycin (ZITHROMAX Z-PAK) 250 MG tablet      Use as directed   6 each   0   . diclofenac (FLECTOR) 1.3 % PTCH   Transdermal   Place 1 patch onto the skin every 12 (twelve) hours as needed.   30 patch   0   . HYDROcodone-acetaminophen (NORCO) 5-325 MG per tablet   Oral   Take 1-2 tablets by mouth every 6 (six) hours as needed for moderate pain (or for cough).   30 tablet   0   . methylPREDNISolone (MEDROL DOSEPAK) 4 MG tablet      Use as directed on package instructions   21 tablet   0   . traMADol (ULTRAM) 50 MG tablet   Oral   Take 1 tablet (50 mg total) by mouth every 6 (six) hours as needed for pain.   20 tablet   0    BP 143/99  Pulse 92  Temp(Src) 98 F (36.7 C) (Oral)  Resp 19  SpO2  100% Physical Exam  Nursing note and vitals reviewed. Constitutional: She is oriented to person, place, and time. Vital signs are normal. She appears well-developed and well-nourished. No distress.  Constantly coughing during physical exam.  HENT:  Head: Normocephalic and atraumatic.  Right Ear: External ear normal.  Left Ear: External ear normal.  Nose: Nose normal. Right sinus exhibits no maxillary sinus tenderness and no frontal sinus tenderness. Left sinus exhibits no maxillary sinus tenderness and no frontal sinus tenderness.  Mouth/Throat: Uvula is midline, oropharynx is clear and moist and mucous membranes are normal. No oropharyngeal  exudate.  Eyes: Conjunctivae are normal. Right eye exhibits no discharge. Left eye exhibits no discharge.  Neck: Normal range of motion. Neck supple.  Cardiovascular: Normal rate, regular rhythm and normal heart sounds.  Exam reveals no gallop and no friction rub.   No murmur heard. Pulmonary/Chest: Effort normal and breath sounds normal. No accessory muscle usage. No respiratory distress. She has no decreased breath sounds. She has no wheezes. She has no rhonchi. She has no rales. She exhibits no tenderness.  Deep breaths trigger coughing fits  Musculoskeletal:       Thoracic back: She exhibits tenderness ( right paraspinous musculature spasm and tenderness), pain and spasm. She exhibits normal range of motion, no bony tenderness, no swelling and no edema.  Lymphadenopathy:    She has no cervical adenopathy.  Neurological: She is alert and oriented to person, place, and time. She has normal strength and normal reflexes. No sensory deficit. She exhibits normal muscle tone. Coordination and gait normal. GCS eye subscore is 4. GCS verbal subscore is 5. GCS motor subscore is 6.  Skin: Skin is warm and dry. No rash noted. She is not diaphoretic.  Psychiatric: She has a normal mood and affect. Judgment normal.    ED Course  Procedures (including critical care time) Labs Review Labs Reviewed - No data to display Imaging Review Dg Chest 2 View  02/18/2014   CLINICAL DATA:  Cough, fever.  EXAM: CHEST  2 VIEW  COMPARISON:  None.  FINDINGS: The heart size and mediastinal contours are within normal limits. Both lungs are clear. The visualized skeletal structures are unremarkable.  IMPRESSION: No acute cardiopulmonary abnormality seen.   Electronically Signed   By: Sabino Dick M.D.   On: 02/18/2014 10:24     MDM   1. Bronchitis   2. Back pain    Chest x-ray normal. History and exam consistent with viral bronchitis. Treating symptomatically, postdated prescription for azithromycin provided if she  does not begin to improve in a few more days or she worsens. Followup when necessary.  Meds ordered this encounter  Medications  . HYDROcodone-acetaminophen (NORCO/VICODIN) 5-325 MG per tablet 2 tablet    Sig:   . albuterol (PROVENTIL HFA;VENTOLIN HFA) 108 (90 BASE) MCG/ACT inhaler    Sig: Inhale 2 puffs into the lungs every 4 (four) hours as needed for wheezing.    Dispense:  1 Inhaler    Refill:  0    Order Specific Question:  Supervising Provider    Answer:  Ihor Gully D [id number]  . methylPREDNISolone (MEDROL DOSEPAK) 4 MG tablet    Sig: Use as directed on package instructions    Dispense:  21 tablet    Refill:  0    Order Specific Question:  Supervising Provider    Answer:  Billy Fischer [phone]  . HYDROcodone-acetaminophen (NORCO) 5-325 MG per tablet    Sig: Take 1-2 tablets by mouth every  6 (six) hours as needed for moderate pain (or for cough).    Dispense:  30 tablet    Refill:  0    Order Specific Question:  Supervising Provider    Answer:  Billy Fischer [phone]  . azithromycin (ZITHROMAX Z-PAK) 250 MG tablet    Sig: Use as directed    Dispense:  6 each    Refill:  0    Order Specific Question:  Supervising Provider    Answer:  Ihor Gully D [[id number]]   \    Liam Graham, PA-C 02/18/14 1030

## 2014-10-21 ENCOUNTER — Encounter (HOSPITAL_COMMUNITY): Payer: Self-pay | Admitting: Emergency Medicine

## 2014-10-21 ENCOUNTER — Ambulatory Visit (HOSPITAL_COMMUNITY): Payer: BC Managed Care – PPO | Attending: Family Medicine

## 2014-10-21 ENCOUNTER — Emergency Department (HOSPITAL_COMMUNITY)
Admission: EM | Admit: 2014-10-21 | Discharge: 2014-10-21 | Disposition: A | Discharge status: Home / Self Care | Payer: BC Managed Care – PPO | Source: Home / Self Care | Attending: Family Medicine | Admitting: Family Medicine

## 2014-10-21 DIAGNOSIS — R05 Cough: Secondary | ICD-10-CM

## 2014-10-21 DIAGNOSIS — J40 Bronchitis, not specified as acute or chronic: Secondary | ICD-10-CM | POA: Diagnosis present

## 2014-10-21 DIAGNOSIS — R053 Chronic cough: Secondary | ICD-10-CM

## 2014-10-21 MED ORDER — IPRATROPIUM BROMIDE 0.06 % NA SOLN: 2.0000 | Freq: Four times a day (QID) | NASAL | Status: DC

## 2014-10-21 MED ORDER — LEVOFLOXACIN 500 MG PO TABS: 500.0000 mg | ORAL_TABLET | Freq: Every day | ORAL | Status: DC

## 2014-10-21 NOTE — Discharge Instructions (Signed)
Thank you for coming in today. Stop all other antibiotics. Take Levaquin daily for 7 days. Use Atrovent nasal spray. Follow-up with primary care provider. Call or go to the emergency room if you get worse, have trouble breathing, have chest pains, or palpitations.    Cough, Adult  A cough is a reflex that helps clear your throat and airways. It can help heal the body or may be a reaction to an irritated airway. A cough may only last 2 or 3 weeks (acute) or may last more than 8 weeks (chronic).  CAUSES Acute cough:  Viral or bacterial infections. Chronic cough:  Infections.  Allergies.  Asthma.  Post-nasal drip.  Smoking.  Heartburn or acid reflux.  Some medicines.  Chronic lung problems (COPD).  Cancer. SYMPTOMS   Cough.  Fever.  Chest pain.  Increased breathing rate.  High-pitched whistling sound when breathing (wheezing).  Colored mucus that you cough up (sputum). TREATMENT   A bacterial cough may be treated with antibiotic medicine.  A viral cough must run its course and will not respond to antibiotics.  Your caregiver may recommend other treatments if you have a chronic cough. HOME CARE INSTRUCTIONS   Only take over-the-counter or prescription medicines for pain, discomfort, or fever as directed by your caregiver. Use cough suppressants only as directed by your caregiver.  Use a cold steam vaporizer or humidifier in your bedroom or home to help loosen secretions.  Sleep in a semi-upright position if your cough is worse at night.  Rest as needed.  Stop smoking if you smoke. SEEK IMMEDIATE MEDICAL CARE IF:   You have pus in your sputum.  Your cough starts to worsen.  You cannot control your cough with suppressants and are losing sleep.  You begin coughing up blood.  You have difficulty breathing.  You develop pain which is getting worse or is uncontrolled with medicine.  You have a fever. MAKE SURE YOU:   Understand these  instructions.  Will watch your condition.  Will get help right away if you are not doing well or get worse. Document Released: 05/15/2011 Document Revised: 02/08/2012 Document Reviewed: 05/15/2011 [facility] Patient Information 2015 [place], [place]. This information is not intended to replace advice given to you by your health care provider. Make sure you discuss any questions you have with your health care provider.

## 2014-10-21 NOTE — ED Provider Notes (Signed)
Brittney Riley is a 43 y.o. female who presents to Urgent Care today for bronchitis. Patient has been sick now for 3 weeks. She has had cough postnasal drip and congestion. She's been seen by her primary care doctor for this issue twice and received a 10 day course of prednisone, Augmentin antibiotics and inhaler Tessalon perles and codeine containing cough medication. She continues to feel ill. No vomiting or diarrhea. No fevers or chills. No chest pains or palpitations.   History reviewed. No pertinent past medical history. Past Surgical History  Procedure Laterality Date  . Cesarean section    . Abdominal hysterectomy     History  Substance Use Topics  . Smoking status: Never Smoker   . Smokeless tobacco: Not on file  . Alcohol Use: No   ROS as above Medications: No current facility-administered medications for this encounter.   Current Outpatient Prescriptions  Medication Sig Dispense Refill  . albuterol (PROVENTIL HFA;VENTOLIN HFA) 108 (90 BASE) MCG/ACT inhaler Inhale 2 puffs into the lungs every 4 (four) hours as needed for wheezing. 1 Inhaler 0  . azithromycin (ZITHROMAX Z-PAK) 250 MG tablet Use as directed 6 each 0  . methylPREDNISolone (MEDROL DOSEPAK) 4 MG tablet Use as directed on package instructions 21 tablet 0  . diclofenac (FLECTOR) 1.3 % PTCH Place 1 patch onto the skin every 12 (twelve) hours as needed. 30 patch 0  . HYDROcodone-acetaminophen (NORCO) 5-325 MG per tablet Take 1-2 tablets by mouth every 6 (six) hours as needed for moderate pain (or for cough). 30 tablet 0  . ipratropium (ATROVENT) 0.06 % nasal spray Place 2 sprays into both nostrils 4 (four) times daily. 15 mL 1  . levofloxacin (LEVAQUIN) 500 MG tablet Take 1 tablet (500 mg total) by mouth daily. 7 tablet 0  . traMADol (ULTRAM) 50 MG tablet Take 1 tablet (50 mg total) by mouth every 6 (six) hours as needed for pain. 20 tablet 0   Allergies  Allergen Reactions  . Doxycycline      Exam:  BP  137/83 mmHg  Pulse 77  Temp(Src) 98.4 F (36.9 C) (Oral)  Resp 16  SpO2 99% Gen: Well NAD HEENT: EOMI,  MMM posterior pharynx with cobblestoning. Normal tympanic membranes bilaterally Lungs: Normal work of breathing. CTABL, frequent coughing. Heart: RRR no MRG Abd: NABS, Soft. Nondistended, Nontender Exts: Brisk capillary refill, warm and well perfused.   No results found for this or any previous visit (from the past 24 hour(s)). Dg Chest 2 View  10/21/2014   CLINICAL DATA:  Bronchitis, productive cough and chest tightness.  EXAM: CHEST - 2 VIEW  COMPARISON:  02/18/2014  FINDINGS: The heart size and mediastinal contours are within normal limits. There is no evidence of pulmonary edema, consolidation, pneumothorax, nodule or pleural fluid. The visualized skeletal structures are unremarkable.  IMPRESSION: No active disease.   Electronically Signed   By: Aletta Edouard M.D.   On: 10/21/2014 10:51    Assessment and Plan: 43 y.o. female with persistent cough. Treat with Levaquin as opposed to Augmentin. Additionally use Atrovent nasal spray for postnasal drip. Follow-up with PCP.  Discussed warning signs or symptoms. Please see discharge instructions. Patient expresses understanding.     Gregor Hams, MD 10/21/14 [phone]

## 2014-10-21 NOTE — ED Notes (Signed)
Reports being dx with bronchitis x 3 weeks.   Pt states that she she has finished all prescribed meds.  But not feeling any better.

## 2014-11-30 HISTORY — PX: COLONOSCOPY: SHX174

## 2014-11-30 HISTORY — PX: BREAST BIOPSY: SHX20

## 2014-12-03 ENCOUNTER — Other Ambulatory Visit: Payer: Self-pay | Admitting: Obstetrics and Gynecology

## 2014-12-03 DIAGNOSIS — N644 Mastodynia: Secondary | ICD-10-CM

## 2014-12-13 ENCOUNTER — Other Ambulatory Visit: Payer: Self-pay | Admitting: Obstetrics and Gynecology

## 2014-12-13 DIAGNOSIS — N644 Mastodynia: Secondary | ICD-10-CM

## 2014-12-21 ENCOUNTER — Other Ambulatory Visit: Payer: Self-pay

## 2014-12-24 ENCOUNTER — Other Ambulatory Visit: Payer: Self-pay

## 2014-12-27 ENCOUNTER — Ambulatory Visit
Admission: RE | Admit: 2014-12-27 | Discharge: 2014-12-27 | Disposition: A | Discharge status: Home / Self Care | Payer: BLUE CROSS/BLUE SHIELD | Source: Ambulatory Visit | Attending: Obstetrics and Gynecology | Admitting: Obstetrics and Gynecology

## 2014-12-27 ENCOUNTER — Other Ambulatory Visit: Payer: Self-pay | Admitting: Obstetrics and Gynecology

## 2014-12-27 DIAGNOSIS — R921 Mammographic calcification found on diagnostic imaging of breast: Secondary | ICD-10-CM

## 2014-12-27 DIAGNOSIS — N644 Mastodynia: Secondary | ICD-10-CM

## 2015-01-08 ENCOUNTER — Other Ambulatory Visit: Payer: Self-pay | Admitting: Obstetrics and Gynecology

## 2015-01-08 ENCOUNTER — Ambulatory Visit
Admission: RE | Admit: 2015-01-08 | Discharge: 2015-01-08 | Disposition: A | Discharge status: Home / Self Care | Payer: BLUE CROSS/BLUE SHIELD | Source: Ambulatory Visit | Attending: Obstetrics and Gynecology | Admitting: Obstetrics and Gynecology

## 2015-01-08 DIAGNOSIS — R921 Mammographic calcification found on diagnostic imaging of breast: Secondary | ICD-10-CM

## 2015-01-24 ENCOUNTER — Encounter: Payer: Self-pay | Admitting: Gastroenterology

## 2015-03-18 ENCOUNTER — Ambulatory Visit (INDEPENDENT_AMBULATORY_CARE_PROVIDER_SITE_OTHER): Payer: BLUE CROSS/BLUE SHIELD | Admitting: Gastroenterology

## 2015-03-18 ENCOUNTER — Encounter: Payer: Self-pay | Admitting: Gastroenterology

## 2015-03-18 VITALS — BP 110/80 | HR 68 | Ht 66.0 in | Wt 177.2 lb

## 2015-03-18 DIAGNOSIS — Z800 Family history of malignant neoplasm of digestive organs: Secondary | ICD-10-CM | POA: Diagnosis not present

## 2015-03-18 DIAGNOSIS — Z8371 Family history of colonic polyps: Secondary | ICD-10-CM | POA: Diagnosis not present

## 2015-03-18 DIAGNOSIS — K219 Gastro-esophageal reflux disease without esophagitis: Secondary | ICD-10-CM | POA: Diagnosis not present

## 2015-03-18 DIAGNOSIS — Z83719 Family history of colon polyps, unspecified: Secondary | ICD-10-CM

## 2015-03-18 MED ORDER — NA SULFATE-K SULFATE-MG SULF 17.5-3.13-1.6 GM/177ML PO SOLN: 1.0000 | Freq: Once | ORAL | Status: DC

## 2015-03-18 MED ORDER — OMEPRAZOLE 20 MG PO CPDR: 20.0000 mg | DELAYED_RELEASE_CAPSULE | Freq: Every day | ORAL | Status: DC

## 2015-03-18 NOTE — Progress Notes (Signed)
History of Present Illness: This is a 44 year old female patient here for the evaluation of GERD and family history of colon polyps and colon cancer. For the past several years the patient has had frequent reflux symptoms. Her reflux symptoms worsen when she gains weight. She has used Tums quite frequently. She tried over-the-counter acid reducer temporarily which helped her symptoms. She has symptoms about 3 or 4 days per week. Her mother has a history of multiple colon polyps (too many to remove-per pat report) in her 40s. Her gastroenterologist in [place], [place] recommend all children start colonoscopies. Patient is unsure if the mother has had any genetic testing. In addition her maternal uncle had colon cancer in his 70s. Denies weight loss, abdominal pain, constipation, diarrhea, change in stool caliber, melena, hematochezia, nausea, vomiting, dysphagia, chest pain.    Allergies  Allergen Reactions  . Levofloxacin Itching    Itching all over - per patient  . Meclizine Other (See Comments)    Patient reports made her groggy  . Doxycycline    Outpatient Prescriptions Prior to Visit  Medication Sig Dispense Refill  . HYDROcodone-acetaminophen (NORCO) 5-325 MG per tablet Take 1-2 tablets by mouth every 6 (six) hours as needed for moderate pain (or for cough). 30 tablet 0  . albuterol (PROVENTIL HFA;VENTOLIN HFA) 108 (90 BASE) MCG/ACT inhaler Inhale 2 puffs into the lungs every 4 (four) hours as needed for wheezing. 1 Inhaler 0  . azithromycin (ZITHROMAX Z-PAK) 250 MG tablet Use as directed 6 each 0  . diclofenac (FLECTOR) 1.3 % PTCH Place 1 patch onto the skin every 12 (twelve) hours as needed. 30 patch 0  . ipratropium (ATROVENT) 0.06 % nasal spray Place 2 sprays into both nostrils 4 (four) times daily. 15 mL 1  . levofloxacin (LEVAQUIN) 500 MG tablet Take 1 tablet (500 mg total) by mouth daily. 7 tablet 0  . methylPREDNISolone (MEDROL DOSEPAK) 4 MG tablet Use as directed on package  instructions 21 tablet 0  . traMADol (ULTRAM) 50 MG tablet Take 1 tablet (50 mg total) by mouth every 6 (six) hours as needed for pain. 20 tablet 0   No facility-administered medications prior to visit.   History reviewed. No pertinent past medical history. Past Surgical History  Procedure Laterality Date  . Cesarean section    . Abdominal hysterectomy    . Appendectomy    . Hernia repair     History   Social History  . Marital Status: Married    Spouse Name: N/A  . Number of Children: 2  . Years of Education: N/A   Occupational History  . OPS Manager    Social History Main Topics  . Smoking status: Never Smoker   . Smokeless tobacco: Never Used  . Alcohol Use: No  . Drug Use: No  . Sexual Activity: Not on file   Other Topics Concern  . None   Social History Narrative   Family History  Problem Relation Age of Onset  . Colon cancer Mother   . Colon cancer Maternal Uncle   . Breast cancer Paternal Aunt   . Liver cancer Maternal Aunt   . Diabetes      maternal  . Irritable bowel syndrome Mother        Review of Systems: Pertinent positive and negative review of systems were noted in the above HPI section. All other review of systems were otherwise negative.  Current Medications, Allergies, Past Medical History, Past Surgical History, Family History  and Social History were reviewed in [employer] record.  Physical Exam: General: Well developed, well nourished, no acute distress Head: Normocephalic and atraumatic Eyes:  sclerae anicteric, EOMI Ears: Normal auditory acuity Mouth: No deformity or lesions Neck: Supple, no masses or thyromegaly Lungs: Clear throughout to auscultation Heart: Regular rate and rhythm; no murmurs, rubs or bruits Abdomen: Soft, non tender and non distended. No masses, hepatosplenomegaly or hernias noted. Normal Bowel sounds Rectal: deferred to colonoscopy Musculoskeletal: Symmetrical with no gross deformities    Skin: No lesions on visible extremities Pulses:  Normal pulses noted Extremities: No clubbing, cyanosis, edema or deformities noted Neurological: Alert oriented x 4, grossly nonfocal Cervical Nodes:  No significant cervical adenopathy Inguinal Nodes: No significant inguinal adenopathy Psychological:  Alert and cooperative. Normal mood and affect  Assessment and Recommendations:  1. Family history of numerous colon polyps in her mother in her 6s. Maternal uncle with colon cancer in his 27s. Attempt to obtain information about type of polyps and if the mother has had genetic testing. Schedule colonoscopy. The risks (including bleeding, perforation, infection, missed lesions, medication reactions and possible hospitalization or surgery if complications occur), benefits, and alternatives to colonoscopy with possible biopsy and possible polypectomy were discussed with the patient and they consent to proceed.   2. GERD. Standard antireflux measures and Prilosec 20 mg daily. If her symptoms are not well controlled consider upper endoscopy for further evaluation.

## 2015-03-18 NOTE — Patient Instructions (Addendum)
We have sent the following medications to your pharmacy for you to pick up at your convenience: Omeprazole.  Patient advised to avoid spicy, acidic, citrus, chocolate, mints, fruit and fruit juices.  Limit the intake of caffeine, alcohol and Soda.  Don't exercise too soon after eating.  Don't lie down within 3-4 hours of eating.  Elevate the head of your bed.  You have been scheduled for a colonoscopy. Please follow written instructions given to you at your visit today.  Please pick up your prep supplies at the pharmacy within the next 1-3 days. If you use inhalers (even only as needed), please bring them with you on the day of your procedure. Your physician has requested that you go to www.startemmi.com and enter the access code given to you at your visit today. This web site gives a general overview about your procedure. However, you should still follow specific instructions given to you by our office regarding your preparation for the procedure.  Thank you for choosing me and [facility].  Pricilla Riffle. Dagoberto Ligas., MD., Marval Regal  cc: Glo Herring, PA

## 2015-03-27 ENCOUNTER — Encounter: Payer: Self-pay | Admitting: Gastroenterology

## 2015-04-18 ENCOUNTER — Telehealth: Payer: Self-pay | Admitting: *Deleted

## 2015-04-18 ENCOUNTER — Encounter: Payer: Self-pay | Admitting: *Deleted

## 2015-04-18 NOTE — Telephone Encounter (Signed)
Patient called having questions about prep instructions. She states she received the Moviprep with pouch A, and pouch B from her pharmacy. Patient should have received Suprep as prescribed by Dr.Stark's CMA. Explained this to patient, but she has already mixed the Moviprep and she wants to use this prep. Explained that we do use the Moviprep, new instructions faxed to patient [phone]. Patient aware and she will call us back with any questions.

## 2015-04-19 ENCOUNTER — Ambulatory Visit (AMBULATORY_SURGERY_CENTER): Payer: BLUE CROSS/BLUE SHIELD | Admitting: Gastroenterology

## 2015-04-19 ENCOUNTER — Encounter: Payer: Self-pay | Admitting: Gastroenterology

## 2015-04-19 VITALS — BP 120/58 | HR 61 | Temp 97.7°F | Resp 17 | Ht 66.0 in | Wt 177.0 lb

## 2015-04-19 DIAGNOSIS — Z800 Family history of malignant neoplasm of digestive organs: Secondary | ICD-10-CM | POA: Diagnosis not present

## 2015-04-19 DIAGNOSIS — D125 Benign neoplasm of sigmoid colon: Secondary | ICD-10-CM

## 2015-04-19 DIAGNOSIS — Z1211 Encounter for screening for malignant neoplasm of colon: Secondary | ICD-10-CM

## 2015-04-19 DIAGNOSIS — D123 Benign neoplasm of transverse colon: Secondary | ICD-10-CM

## 2015-04-19 MED ORDER — SODIUM CHLORIDE 0.9 % IV SOLN: 500.0000 mL | INTRAVENOUS | Status: DC

## 2015-04-19 NOTE — Progress Notes (Signed)
Transferred to PACU.  NAD.  Report to Opal Sidles, Therapist, sports.

## 2015-04-19 NOTE — Patient Instructions (Signed)
YOU HAD AN ENDOSCOPIC PROCEDURE TODAY AT THE [facility]:   Refer to the procedure report that was given to you for any specific questions about what was found during the examination.  If the procedure report does not answer your questions, please call your gastroenterologist to clarify.  If you requested that your care partner not be given the details of your procedure findings, then the procedure report has been included in a sealed envelope for you to review at your convenience later.  YOU SHOULD EXPECT: Some feelings of bloating in the abdomen. Passage of more gas than usual.  Walking can help get rid of the air that was put into your GI tract during the procedure and reduce the bloating. If you had a lower endoscopy (such as a colonoscopy or flexible sigmoidoscopy) you may notice spotting of blood in your stool or on the toilet paper. If you underwent a bowel prep for your procedure, you may not have a normal bowel movement for a few days.  Please Note:  You might notice some irritation and congestion in your nose or some drainage.  This is from the oxygen used during your procedure.  There is no need for concern and it should clear up in a day or so.  SYMPTOMS TO REPORT IMMEDIATELY:   Following lower endoscopy (colonoscopy or flexible sigmoidoscopy):  Excessive amounts of blood in the stool  Significant tenderness or worsening of abdominal pains  Swelling of the abdomen that is new, acute  Fever of 100F or higher   For urgent or emergent issues, a gastroenterologist can be reached at any hour by calling [phone].   DIET: Your first meal following the procedure should be a small meal and then it is ok to progress to your normal diet. Heavy or fried foods are harder to digest and may make you feel nauseous or bloated.  Likewise, meals heavy in dairy and vegetables can increase bloating.  Drink plenty of fluids but you should avoid alcoholic beverages for 24  hours.  ACTIVITY:  You should plan to take it easy for the rest of today and you should NOT DRIVE or use heavy machinery until tomorrow (because of the sedation medicines used during the test).    FOLLOW UP: Our staff will call the number listed on your records the next business day following your procedure to check on you and address any questions or concerns that you may have regarding the information given to you following your procedure. If we do not reach you, we will leave a message.  However, if you are feeling well and you are not experiencing any problems, there is no need to return our call.  We will assume that you have returned to your regular daily activities without incident.  If any biopsies were taken you will be contacted by phone or by letter within the next 1-3 weeks.  Please call us at [phone] if you have not heard about the biopsies in 3 weeks.    SIGNATURES/CONFIDENTIALITY: You and/or your care partner have signed paperwork which will be entered into your electronic medical record.  These signatures attest to the fact that that the information above on your After Visit Summary has been reviewed and is understood.  Full responsibility of the confidentiality of this discharge information lies with you and/or your care-partner.  Polyp information given.  

## 2015-04-19 NOTE — Progress Notes (Signed)
Called to room to assist during endoscopic procedure.  Patient ID and intended procedure confirmed with present staff. Received instructions for my participation in the procedure from the performing physician.  

## 2015-04-19 NOTE — Op Note (Signed)
[facility]  [address] [place], [address]   COLONOSCOPY PROCEDURE REPORT  PATIENT: Brittney Riley, Brittney Riley  MR#: [id number] BIRTHDATE: Dec 17, 1970 , 40  yrs. old GENDER: female ENDOSCOPIST: Ladene Artist, MD, [facility] REFERRED BY: Glo Herring, MD PROCEDURE DATE:  04/19/2015 PROCEDURE:   Colonoscopy, screening and Colonoscopy with snare polypectomy First Screening Colonoscopy - Avg.  risk and is 50 yrs.  old or older Yes.  Prior Negative Screening - Now for repeat screening. N/A  History of Adenoma - Now for follow-up colonoscopy & has been > or = to 3 yrs.  N/A  Polyps removed today? Yes ASA CLASS:   Class II INDICATIONS:Screening for colonic neoplasia and FH Colon or Rectal Adenocarcinoma. MEDICATIONS: Monitored anesthesia care and Propofol 200 mg IV DESCRIPTION OF PROCEDURE:   After the risks benefits and alternatives of the procedure were thoroughly explained, informed consent was obtained.  The digital rectal exam revealed no abnormalities of the rectum.   The LB [id number]  endoscope was introduced through the anus and advanced to the cecum, which was identified by both the appendix and ileocecal valve. No adverse events experienced.   The quality of the prep was excellent. (Suprep was used)  The instrument was then slowly withdrawn as the colon was fully examined. Estimated blood loss is zero unless otherwise noted in this procedure report.    COLON FINDINGS: Two sessile polyps measuring 6 mm in size were found in the sigmoid colon and transverse colon.  Polypectomies were performed with a cold snare.  The resection was complete, the polyp tissue was completely retrieved and sent to histology.   The examination was otherwise normal.  Retroflexed views revealed no abnormalities. The time to cecum = 0.6 Withdrawal time = 9.6   The scope was withdrawn and the procedure completed. COMPLICATIONS: There were no immediate  complications.  ENDOSCOPIC IMPRESSION: 1.   Two sessile polyps in the sigmoid colon and transverse colon; polypectomies performed with a cold snare 2.   The examination was otherwise normal  RECOMMENDATIONS: 1.  Await pathology results 2.  Repeat Colonoscopy in 5 years.  eSigned:  Ladene Artist, MD, [facility] 04/19/2015 3:12 PM

## 2015-04-22 ENCOUNTER — Telehealth: Payer: Self-pay

## 2015-04-22 NOTE — Telephone Encounter (Signed)
  Follow up Call-  Call back number 04/19/2015  Post procedure Call Back phone  # [phone]  Permission to leave phone message Yes     Patient questions:  Do you have a fever, pain , or abdominal swelling? No. Pain Score  0 *  Have you tolerated food without any problems? Yes.    Have you been able to return to your normal activities? Yes   Do you have any questions about your discharge instructions: Diet   No. Medications  No. Follow up visit  No.  Do you have questions or concerns about your Care? No.  Actions: * If pain score is 4 or above: No action needed, pain <4.

## 2015-04-25 DIAGNOSIS — K635 Polyp of colon: Secondary | ICD-10-CM

## 2015-04-25 HISTORY — DX: Polyp of colon: K63.5

## 2015-04-30 ENCOUNTER — Encounter: Payer: Self-pay | Admitting: Gastroenterology

## 2015-07-01 ENCOUNTER — Other Ambulatory Visit: Payer: Self-pay

## 2015-07-01 MED ORDER — OMEPRAZOLE 20 MG PO CPDR: 20.0000 mg | DELAYED_RELEASE_CAPSULE | Freq: Every day | ORAL | Status: DC

## 2016-06-16 ENCOUNTER — Telehealth: Payer: Self-pay | Admitting: Gastroenterology

## 2016-06-16 ENCOUNTER — Encounter: Payer: Self-pay | Admitting: *Deleted

## 2016-06-16 NOTE — Telephone Encounter (Signed)
Patient notified she will need an office visit to discuss.  She will come in and see Alonza Bogus, PA at 2:30

## 2016-06-17 ENCOUNTER — Ambulatory Visit (INDEPENDENT_AMBULATORY_CARE_PROVIDER_SITE_OTHER): Payer: BLUE CROSS/BLUE SHIELD | Admitting: Gastroenterology

## 2016-06-17 ENCOUNTER — Encounter: Payer: Self-pay | Admitting: Gastroenterology

## 2016-06-17 ENCOUNTER — Other Ambulatory Visit (INDEPENDENT_AMBULATORY_CARE_PROVIDER_SITE_OTHER): Payer: BLUE CROSS/BLUE SHIELD

## 2016-06-17 VITALS — BP 110/68 | HR 76 | Ht 66.0 in | Wt 180.0 lb

## 2016-06-17 DIAGNOSIS — R5383 Other fatigue: Secondary | ICD-10-CM | POA: Diagnosis not present

## 2016-06-17 DIAGNOSIS — K6289 Other specified diseases of anus and rectum: Secondary | ICD-10-CM | POA: Diagnosis not present

## 2016-06-17 LAB — FOLATE: Folate: 12.8 ng/mL (ref >5.9)

## 2016-06-17 LAB — CBC WITH DIFFERENTIAL/PLATELET
Basophils Absolute: 0 10*3/uL (ref 0.0–0.1)
Basophils Relative: 0.3 % (ref 0.0–3.0)
EOS PCT: 1.8 % (ref 0.0–5.0)
Eosinophils Absolute: 0.1 10*3/uL (ref 0.0–0.7)
HCT: 40.6 % (ref 36.0–46.0)
Hemoglobin: 13.6 g/dL (ref 12.0–15.0)
LYMPHS ABS: 2.1 10*3/uL (ref 0.7–4.0)
Lymphocytes Relative: 33.6 % (ref 12.0–46.0)
MCHC: 33.6 g/dL (ref 30.0–36.0)
MCV: 87.4 fl (ref 78.0–100.0)
MONO ABS: 0.3 10*3/uL (ref 0.1–1.0)
MONOS PCT: 5.3 % (ref 3.0–12.0)
NEUTROS ABS: 3.7 10*3/uL (ref 1.4–7.7)
NEUTROS PCT: 59 % (ref 43.0–77.0)
PLATELETS: 234 10*3/uL (ref 150.0–400.0)
RBC: 4.64 Mil/uL (ref 3.87–5.11)
RDW: 13.4 % (ref 11.5–15.5)
WBC: 6.3 10*3/uL (ref 4.0–10.5)

## 2016-06-17 LAB — COMPREHENSIVE METABOLIC PANEL
ALT: 41 U/L — AB (ref 0–35)
AST: 24 U/L (ref 0–37)
Albumin: 4.5 g/dL (ref 3.5–5.2)
Alkaline Phosphatase: 79 U/L (ref 39–117)
BUN: 18 mg/dL (ref 6–23)
CO2: 28 meq/L (ref 19–32)
Calcium: 9.5 mg/dL (ref 8.4–10.5)
Chloride: 104 mEq/L (ref 96–112)
Creatinine, Ser: 0.77 mg/dL (ref 0.40–1.20)
GFR: 85.27 mL/min (ref >60.00)
GLUCOSE: 87 mg/dL (ref 70–99)
POTASSIUM: 4.4 meq/L (ref 3.5–5.1)
SODIUM: 138 meq/L (ref 135–145)
Total Bilirubin: 0.4 mg/dL (ref 0.2–1.2)
Total Protein: 7.4 g/dL (ref 6.0–8.3)

## 2016-06-17 LAB — TSH: TSH: 1.37 u[IU]/mL (ref 0.35–4.50)

## 2016-06-17 LAB — VITAMIN B12: Vitamin B-12: 517 pg/mL (ref 211–911)

## 2016-06-17 MED ORDER — HYDROCORTISONE ACETATE 25 MG RE SUPP: 25.0000 mg | Freq: Two times a day (BID) | RECTAL | Status: DC

## 2016-06-17 NOTE — Patient Instructions (Signed)
Please go to the basement level to have your labs drawn.   We sent a prescription for Hydrocortisone suppositories to [facility] [address].

## 2016-06-18 ENCOUNTER — Encounter: Payer: Self-pay | Admitting: Gastroenterology

## 2016-06-18 DIAGNOSIS — R5383 Other fatigue: Secondary | ICD-10-CM | POA: Insufficient documentation

## 2016-06-18 DIAGNOSIS — K6289 Other specified diseases of anus and rectum: Secondary | ICD-10-CM | POA: Insufficient documentation

## 2016-06-18 NOTE — Progress Notes (Signed)
     06/18/2016 Brittney Riley [id number] 07/17/1971   History of Present Illness:  This is a 45 year old female who is known to Dr. Fuller Plan for colonoscopy last year.  Procedure was in May 2016 and she was found to have two sessile polyps in the sigmoid colon and transverse colon that were removed and were TA's on pathology.  This was performed prior to age 3 due to a family history of colon cancer in her mother who currently has metastatic colon cancer in her 47's (she reports that he mother "had too may polyps to remove" and she apparently also had a maternal aunt and uncle with colon cancer.  Unsure if her mother had genetic testing.    Anyway, she comes in today with complaints of rectal pain.  Tells me that this pain is described as a shooting pain on and off for the past couple of weeks.  Says that she just gets a shooting pain that lasts several seconds to minutes and then goes away.  Says that one day at work it hurt to sit down and lasted the whole morning but then resolved.  Not painful to move her bowels.  Also says that yesterday she started noticing a "vibration feeling" in her rectum that she cannot describe well.  No rectal bleeding.  She is very anxious about this with her family history of colon cancer.    Also reports generalized fatigue.  She says that she is in the process of establishing with a new PCP but is requesting labs today, specifically requests Vitamin B12 levels and Vitamin D levels since these have been low for her in the past.     Current Medications, Allergies, Past Medical History, Past Surgical History, Family History and Social History were reviewed in [employer] record.   Physical Exam: BP 110/68 mmHg  Pulse 76  Ht 5\' 6"  (1.676 m)  Wt 180 lb (81.647 kg)  BMI 29.07 kg/m2 General: Well developed white female in no acute distress Head: Normocephalic and atraumatic Eyes:  Sclerae anicteric, conjunctiva pink  Ears: Normal auditory  acuity Lungs: Clear throughout to auscultation Heart: Regular rate and rhythm Abdomen: Soft, non-distended.  Normal bowel sounds.  Non-tender. Rectal:  No external abnormalities identified.  DRE did not reveal any masses.  Light brown, hemoccult negative stool on exam glove.  Anoscopy performed and showed internal hemorrhoids. Musculoskeletal: Symmetrical with no gross deformities  Extremities: No edema  Neurological: Alert oriented x 4, grossly non-focal Psychological:  Alert and cooperative. Normal mood and affect  Assessment and Recommendations: -Rectal pain:  Only internal hemorrhoids noted on exam today.  Colonoscopy ok one year ago.  Will treat with hydrocortisone suppositories for 10 days and see if this helps.  ? If this is some time of rectal spasm vs irritation from hemorrhoids vs nerve pain.  Reassurance given.  Follow-up if issue not resolved with suppositories or certainly if symptoms worsen. -Fatigue:  Patient says that she is in the process of establishing with a new PCP but is requesting labs today, specifically requests Vitamin B12 levels and Vitamin D levels since these have been low for her in the past.  Will check those along with CBC, CMP, TSH but was told that she needs to establish with new PCP soon.

## 2016-06-18 NOTE — Progress Notes (Signed)
Reviewed and agree with management plan.  Stashia Sia T. Juma Oxley, MD FACG 

## 2016-06-20 LAB — VITAMIN D PNL(25-HYDRXY+1,25-DIHY)-BLD
VITAMIN D 1, 25 (OH) TOTAL: 52 pg/mL (ref 18–72)
VITAMIN D3 1, 25 (OH): 52 pg/mL
Vit D, 25-Hydroxy: 22 ng/mL — ABNORMAL LOW (ref 30–100)
Vitamin D2 1, 25 (OH)2: <8 pg/mL

## 2017-01-07 ENCOUNTER — Other Ambulatory Visit: Payer: Self-pay | Admitting: Obstetrics and Gynecology

## 2017-01-07 DIAGNOSIS — Z1231 Encounter for screening mammogram for malignant neoplasm of breast: Secondary | ICD-10-CM

## 2017-01-29 ENCOUNTER — Ambulatory Visit
Admission: RE | Admit: 2017-01-29 | Discharge: 2017-01-29 | Disposition: A | Discharge status: Home / Self Care | Payer: BLUE CROSS/BLUE SHIELD | Source: Ambulatory Visit | Attending: Obstetrics and Gynecology | Admitting: Obstetrics and Gynecology

## 2017-01-29 DIAGNOSIS — Z1231 Encounter for screening mammogram for malignant neoplasm of breast: Secondary | ICD-10-CM

## 2017-07-09 ENCOUNTER — Encounter: Payer: Self-pay | Admitting: Family Medicine

## 2017-07-09 ENCOUNTER — Ambulatory Visit (INDEPENDENT_AMBULATORY_CARE_PROVIDER_SITE_OTHER): Payer: BLUE CROSS/BLUE SHIELD | Admitting: Family Medicine

## 2017-07-09 VITALS — BP 120/79 | HR 81 | Temp 98.7°F | Ht 66.0 in | Wt 177.0 lb

## 2017-07-09 DIAGNOSIS — R930 Abnormal findings on diagnostic imaging of skull and head, not elsewhere classified: Secondary | ICD-10-CM | POA: Diagnosis not present

## 2017-07-09 DIAGNOSIS — R9082 White matter disease, unspecified: Secondary | ICD-10-CM

## 2017-07-09 DIAGNOSIS — R42 Dizziness and giddiness: Secondary | ICD-10-CM | POA: Diagnosis not present

## 2017-07-09 NOTE — Patient Instructions (Addendum)
It was good to see you today!  For your dizziness and your episode of L sided numbness that occurred a couple of weeks ago, - I have placed a referral to neurology, please let us know if you have not heard back in 2 weeks  For your stress  - Stress/anxiety is common in our stressful world. If you're feeling down or losing interest in things you normally enjoy or feeling overwhelmed, please come in for a visit.    We will get records from your previous PCP for your colonoscopy.  Please make a lab visit appointment at your convenience for baseline labs, you be fasting for this set.  If results require attention, either myself or my nurse will get in touch with you. If everything is normal, you will get a letter in the mail or a message in My Chart. Please give [place] a call if you do not hear from [place] after 2 weeks   Please check-out at the front desk before leaving the clinic. Make an appointment in  2 months to follow up on your records.  Please bring all of your medications including supplements with you to each visit.   Sign up for My Chart to have easy access to your labs results, and communication with your primary care physician.  Feel free to call with any questions or concerns at any time, at [phone].   Take care,  Dr. Bufford Lope, [place]

## 2017-07-09 NOTE — Progress Notes (Signed)
Subjective:  Brittney Riley is a 47 y.o. female who presents to the [facility] today to establish as new patient and for neurology referral  HPI: Previously seen at [facility] 2 years ago, no PCP since  Dizziness/vertigo - Has a h/o intermittent dizziness/vertigo, with current episode for the last few months which is typical for her. She gets these for few months and then is fine for awhile before it returns - States saw neurology years ago because she had had had a time when she couldn't feel the L side of her body. She states that she recalls having an MRI at that time and was told she had white matter changes but did not proceed with further work up at that time. Previously saw Dr. Noberto Retort at [facility] at a location that was farther away and would like to see someone closer to her current location  - A couple weeks ago had episode where she was concerned that couldn't control L side of body but had no numbness/tingling and fell. Similar to episodes she's had at work where she will feel weak and jittery. - Does endorse lots of stress in her life, specific triggers are that her workplace has a lot of new hires and her mother is undergoing surgery soon for cancer.  Skin rash/itching - Has had itching intermittently along the outside of her R thigh, sometimes had bumps but still having itching currently without a rash currently - No redness or warmth - Has had dry skin - no new detergents/lotions  Health Maintenance as per new patient paperwork: - Colonoscopy done in 2016, 2 precancerous polyps with repeat in 5 years. Patient would like repeat sooner d/t her mother's colon cancer but was denied  - Mammogram 2018 normal - pap 2017 normal - TDAP uncertain but thinks within last 10 years   ROS: per, otherwise all systems reviewed and are negative  PMH:  The following were reviewed and entered/updated in epic: Past Medical History:  Diagnosis Date  . 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 Vertigo   . Vitamin B deficiency   . Vitamin D deficiency    Patient Active Problem List   Diagnosis Date Noted  . Rectal pain 06/18/2016  . Other fatigue 06/18/2016   Past Surgical History:  Procedure Laterality Date  . ABDOMINAL HYSTERECTOMY  2012  . APPENDECTOMY  1986  . BREAST BIOPSY Left 2016  . [place], 2002  . HERNIA REPAIR  2671   umbilical  . OVARIAN CYST REMOVAL  1993    Family History  Problem Relation Age of Onset  . Colon cancer Mother        dx late 45's  . Colon polyps Mother        many, cancerous  . Diabetes Mother   . Depression Mother   . Hypercholesterolemia Mother   . Hypertension Mother   . Colon cancer Maternal Uncle   . Breast cancer Paternal Aunt   . Liver cancer Maternal Aunt   . Colon cancer Maternal Aunt   . Alcohol abuse Father   . Drug abuse Brother   . Alcohol abuse Brother   . Depression Brother   . Drug abuse Brother   . Alcohol abuse Brother   . Depression Brother     Medications- reviewed and updated No current outpatient prescriptions on file.   No current facility-administered medications for this visit.     Allergies-reviewed and updated Allergies  Allergen Reactions  . Doxycycline Other (See Comments)    Throat closed up  . Levofloxacin Itching and Rash    Itching all over - per patient  . Meclizine Other (See Comments)    Patient reports made her groggy    Social History   Social History  . Marital status: Married    Spouse name: N/A  . Number of children: 2  . 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 Smoking status: Former Smoker    Packs/day: 1.00    Years: 15.00    Types: Cigarettes    Quit date: 05/01/2003  . Smokeless tobacco: Never Used  . Alcohol use No  . Drug use: No  . Sexual activity: Not Asked   Other Topics Concern  . None   Social History Narrative  . None     Objective:  Physical Exam: BP 120/79   Pulse 81   Temp 98.7 F (37.1 C) (Oral)   Ht 5\' 6"  (1.676 m)   Wt 177 lb (80.3 kg)   SpO2 98%   BMI 28.57 kg/m   Gen: NAD, resting comfortably CV: RRR with no murmurs appreciated Pulm: NWOB, CTAB with no crackles, wheezes, or rhonchi GI: Normal bowel sounds present. Soft, Nontender, Nondistended. MSK: no edema, cyanosis, or clubbing noted Skin: dry flaky skin on R outer thigh with small scant scattered nonerythematous papules Neuro: grossly normal, moves all extremities Psych: Normal affect and thought content   Assessment/Plan:  Dizziness Likely multiple processes contributing given h/o white matter disease and increased stress. No symptoms today and neuro exam is normal so no concern for active flare.  - Refer back to neurology  - Discussed coping mechanisms and social support to deal with stress - Offered counseling but patient declined at this time - Obtain records from previous PCP  - Check baseline labs today   Dry skin No active rash. Advised patient to use moisturizer at home for barrier protection  Follow up in 2 months to discuss remainder of patient concerns after obtaining records  Bufford Lope, DO PGY-2, [facility] 07/09/2017 2:43 PM

## 2017-07-10 ENCOUNTER — Encounter: Payer: Self-pay | Admitting: Family Medicine

## 2017-07-10 DIAGNOSIS — R55 Syncope and collapse: Secondary | ICD-10-CM | POA: Insufficient documentation

## 2017-07-10 DIAGNOSIS — R42 Dizziness and giddiness: Secondary | ICD-10-CM | POA: Insufficient documentation

## 2017-07-10 NOTE — Assessment & Plan Note (Addendum)
Likely multiple processes contributing given h/o white matter disease and increased stress. No symptoms today and neuro exam is normal so no concern for active flare.  - Refer back to neurology  - Discussed coping mechanisms and social support to deal with stress - Offered counseling but patient declined at this time - Obtain records from previous PCP  - Check baseline labs today

## 2017-07-13 ENCOUNTER — Other Ambulatory Visit: Payer: BLUE CROSS/BLUE SHIELD

## 2017-07-13 ENCOUNTER — Encounter: Payer: Self-pay | Admitting: Neurology

## 2017-07-13 DIAGNOSIS — R42 Dizziness and giddiness: Secondary | ICD-10-CM

## 2017-07-14 ENCOUNTER — Encounter: Payer: Self-pay | Admitting: Family Medicine

## 2017-07-14 LAB — CBC
HEMATOCRIT: 41.4 % (ref 34.0–46.6)
HEMOGLOBIN: 13.3 g/dL (ref 11.1–15.9)
MCH: 29.1 pg (ref 26.6–33.0)
MCHC: 32.1 g/dL (ref 31.5–35.7)
MCV: 91 fL (ref 79–97)
Platelets: 210 10*3/uL (ref 150–379)
RBC: 4.57 x10E6/uL (ref 3.77–5.28)
RDW: 13.7 % (ref 12.3–15.4)
WBC: 4.1 10*3/uL (ref 3.4–10.8)

## 2017-07-14 LAB — CMP14+EGFR
A/G RATIO: 1.8 (ref 1.2–2.2)
ALBUMIN: 4.3 g/dL (ref 3.5–5.5)
ALK PHOS: 79 IU/L (ref 39–117)
ALT: 29 IU/L (ref 0–32)
AST: 23 IU/L (ref 0–40)
BILIRUBIN TOTAL: 0.3 mg/dL (ref 0.0–1.2)
BUN / CREAT RATIO: 30 — AB (ref 9–23)
BUN: 22 mg/dL (ref 6–24)
CHLORIDE: 106 mmol/L (ref 96–106)
CO2: 22 mmol/L (ref 20–29)
Calcium: 9.2 mg/dL (ref 8.7–10.2)
Creatinine, Ser: 0.74 mg/dL (ref 0.57–1.00)
GFR calc Af Amer: 111 mL/min/{1.73_m2} (ref >59)
GFR calc non Af Amer: 96 mL/min/{1.73_m2} (ref >59)
GLOBULIN, TOTAL: 2.4 g/dL (ref 1.5–4.5)
Glucose: 87 mg/dL (ref 65–99)
POTASSIUM: 4.7 mmol/L (ref 3.5–5.2)
SODIUM: 139 mmol/L (ref 134–144)
Total Protein: 6.7 g/dL (ref 6.0–8.5)

## 2017-07-14 LAB — VITAMIN B12: Vitamin B-12: 286 pg/mL (ref 232–1245)

## 2017-07-14 LAB — LIPID PANEL
CHOLESTEROL TOTAL: 220 mg/dL — AB (ref 100–199)
Chol/HDL Ratio: 4.3 ratio (ref 0.0–4.4)
HDL: 51 mg/dL (ref >39)
LDL CALC: 156 mg/dL — AB (ref 0–99)
Triglycerides: 64 mg/dL (ref 0–149)
VLDL Cholesterol Cal: 13 mg/dL (ref 5–40)

## 2017-07-14 LAB — VITAMIN D 25 HYDROXY (VIT D DEFICIENCY, FRACTURES): VIT D 25 HYDROXY: 19.2 ng/mL — AB (ref 30.0–100.0)

## 2017-08-04 ENCOUNTER — Encounter: Payer: Self-pay | Admitting: Neurology

## 2017-08-04 ENCOUNTER — Ambulatory Visit (INDEPENDENT_AMBULATORY_CARE_PROVIDER_SITE_OTHER): Payer: BLUE CROSS/BLUE SHIELD | Admitting: Neurology

## 2017-08-04 VITALS — BP 126/84 | HR 76 | Ht 66.0 in | Wt 179.0 lb

## 2017-08-04 DIAGNOSIS — R413 Other amnesia: Secondary | ICD-10-CM

## 2017-08-04 DIAGNOSIS — R42 Dizziness and giddiness: Secondary | ICD-10-CM | POA: Diagnosis not present

## 2017-08-04 DIAGNOSIS — R531 Weakness: Secondary | ICD-10-CM

## 2017-08-04 NOTE — Patient Instructions (Addendum)
1. Schedule MRI brain with and without contrast  We have referred you to [facility] for your OPEN MRI. They will contact you to schedule your appointment.  Please arrive 30 minutes prior and go to [place]. If you need to contact them directly for any reason please call [phone].  2. Schedule routine EEG 3. Our office will call you with test results, we may schedule MRI spine depending on brain results, follow-up after tests

## 2017-08-04 NOTE — Progress Notes (Signed)
NEUROLOGY CONSULTATION NOTE  Brittney Riley MRN: [id number] DOB: 10-01-1971  Referring provider: Dr. Orson Riley Primary care provider: Dr. Orson Riley  Reason for consult:  Dizziness, abnormal MRI brain  Dear Dr Brittney Riley:  Thank you for your kind referral of Brittney Riley for consultation of the above symptoms. Although her history is well known to you, please allow me to reiterate it for the purpose of our medical record. The patient was accompanied to the clinic by her husband who also provides collateral information. Records and images were personally reviewed where available.  HISTORY OF PRESENT ILLNESS: This is a very pleasant 46 year old right-handed woman presenting for evaluation of episodes of left-sided symptoms and vertigo. The first episode occurred 10 years ago, she was at work, stood up and found that her left arm and leg felt heavy, her left leg would not hold her weight. She went to the ER and recalls the left arm was heavy and numb, there was tingling down her left leg. Face was not affected. She denied any headache, dizziness, confusion or speech difficulties. Symptoms lasted for 3 days. She saw a neurologist and had an MRI brain and was told she had white matter abnormalities thinking maybe she had MS but needed to do more studies. She refused to do an LP. Around 7-8 years ago, she started having episodes of spinning "like my eyes are going around," the first episode lasted a few weeks where she had to watch how she moved her head, spinning was worse with head movements. There was no nausea/vomiting, diplopia, dysarthria/dysphagia, focal numbness/tingling/wekaness, or headache associated with them. Meclizine made her sleepy, she has not taken any medications. She would go for months without symptoms, but over the past year or so, she noticed the vertigo would last for a few months. She can function normally through them, and mostly avoids quick movements. The last bout was a  few months ago, she had been having the vertigo for a few months, then in mid-July, she was visiting her mother and when she tried to turn to the left, it felt like her left leg "would not catch up with her brain." She started to fall and her sister helped her. The symptoms lasted only for 10 seconds. Her head felt weird on the left side and it felt like her leg was not there, different from the episode 10 years ago. There was no headaches, she was not having the vertigo at that particular time, no confusion. Arm and face were not affected. She has been sleep-deprived recently helping her mother every weekend.   She denies any headaches, diplopia, dysarthria/dysphagia, hearing loss, bowel/bladder dysfunction. She has occasional bilateral high-pitched tinnitus. She has occasional neck and back pain. No Lhermitte's sign. She has memory problems, "I can't remember anything anymore." She has occasional numbness and tingling in both hands and fingers, worse when typing or holding the phone. 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 She has occasional wrist pain. She denies any prior history of vision loss. No family history of similar symptoms. She denies any olfactory/gustatory hallucinations, rising epigastric sensation, myoclonic jerks. She had a normal birth and early development, no history of febrile convulsions, CNS infections, significant head injuries, or family history of seizures.   Laboratory Data: Lab Results  Component Value Date   TSH 1.37 06/17/2016   Lab Results  Component Value Date   CHOL 220 (H) 07/13/2017   HDL 51 07/13/2017   LDLCALC 156 (H) 07/13/2017  TRIG 64 07/13/2017   CHOLHDL 4.3 07/13/2017   Vitamin D level 19.2 Lab Results  Component Value Date   VITAMINB12 286 07/13/2017    PAST MEDICAL HISTORY: Past Medical History:  Diagnosis Date  . 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 Vertigo   . Vitamin B deficiency   . Vitamin D deficiency     PAST SURGICAL  HISTORY: Past Surgical History:  Procedure Laterality Date  . ABDOMINAL HYSTERECTOMY  2012  . APPENDECTOMY  1986  . BREAST BIOPSY Left 2016  . [place], 2002  . HERNIA REPAIR  7989   umbilical  . OVARIAN CYST REMOVAL  1993    MEDICATIONS: No current outpatient prescriptions on file prior to visit.   No current facility-administered medications on file prior to visit.     ALLERGIES: Allergies  Allergen Reactions  . Doxycycline Other (See Comments)    Throat closed up  . Levofloxacin Itching and Rash    Itching all over - per patient  . Meclizine Other (See Comments)    Patient reports made her groggy    FAMILY HISTORY: Family History  Problem Relation Age of Onset  . Colon cancer Mother        dx late 81's  . Colon polyps Mother        many, cancerous  . Diabetes Mother   . Depression Mother   . Hypercholesterolemia Mother   . Hypertension Mother   . Colon cancer Maternal Uncle   . Breast cancer Paternal Aunt   . Liver cancer Maternal Aunt   . Colon cancer Maternal Aunt   . Alcohol abuse Father   . Drug abuse Brother   . Alcohol abuse Brother   . Depression Brother   . Drug abuse Brother   . Alcohol abuse Brother   . Depression Brother     SOCIAL HISTORY: Social History   Social History  . Marital status: Married    Spouse name: N/A  . Number of children: 2  . 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 Smoking status: Former Smoker    Packs/day: 1.00    Years: 15.00    Types: Cigarettes    Quit date: 05/01/2003  . Smokeless tobacco: Never Used  . Alcohol use No  . Drug use: No  . Sexual activity: Yes    Partners: Male   Other Topics Concern  . Not on file   Social History Narrative   Lives with Brittney Riley (husband), Brittney Riley (daughter), Brittney Riley (son), Brittney Riley (grandson), 3 dogs     REVIEW OF SYSTEMS: Constitutional: No fevers, chills, or sweats, no generalized fatigue, change in  appetite Eyes: No visual changes, double vision, eye pain Ear, nose and throat: No hearing loss, ear pain, nasal congestion, sore throat Cardiovascular: No chest pain, palpitations Respiratory:  No shortness of breath at rest or with exertion, wheezes GastrointestinaI: No nausea, vomiting, diarrhea, abdominal pain, fecal incontinence Genitourinary:  No dysuria, urinary retention or frequency Musculoskeletal:  + neck pain, back pain Integumentary: No rash, pruritus, skin lesions Neurological: as above Psychiatric: No depression, insomnia, anxiety Endocrine: No palpitations, fatigue, diaphoresis, mood swings, change in appetite, change in weight, increased thirst Hematologic/Lymphatic:  No anemia, purpura, petechiae. Allergic/Immunologic: no itchy/runny eyes, nasal congestion, recent allergic reactions, rashes  PHYSICAL EXAM: Vitals:   08/04/17 0856  BP: 126/84  Pulse: 76  SpO2: 98%   General: No acute  distress Head:  Normocephalic/atraumatic Eyes: Fundoscopic exam shows bilateral sharp discs, no vessel changes, exudates, or hemorrhages Neck: supple, no paraspinal tenderness, full range of motion Back: No paraspinal tenderness Heart: regular rate and rhythm Lungs: Clear to auscultation bilaterally. Vascular: No carotid bruits. Skin/Extremities: No rash, no edema Neurological Exam: Mental status: alert and oriented to person, place, and time, no dysarthria or aphasia, Fund of knowledge is appropriate.  Recent and remote memory are intact.  Attention and concentration are normal.    Able to name objects and repeat phrases. CDT 5/5  MMSE - Mini Mental State Exam 08/04/2017  Orientation to time 5  Orientation to Place 5  Registration 3  Attention/ Calculation 5  Recall 2  Language- name 2 objects 2  Language- repeat 1  Language- follow 3 step command 3  Language- read & follow direction 1  Write a sentence 1  Copy design 1  Total score 29   Cranial nerves: CN I: not tested CN  II: pupils equal, round and reactive to light, visual fields intact, fundi unremarkable. CN III, IV, VI:  full range of motion, no nystagmus, no ptosis CN V: facial sensation intact CN VII: upper and lower face symmetric CN VIII: hearing intact to finger rub CN IX, X: gag intact, uvula midline CN XI: sternocleidomastoid and trapezius muscles intact CN XII: tongue midline Bulk & Tone: normal, no fasciculations. Motor: 5/5 throughout with no pronator drift. Sensation: intact to light touch, cold, pin, vibration and joint position sense.  No extinction to double simultaneous stimulation.  Romberg test negative Deep Tendon Reflexes: +2 throughout, no ankle clonus Plantar responses: downgoing bilaterally Cerebellar: no incoordination on finger to nose, heel to shin. No dysdiadochokinesia Gait: narrow-based and steady, able to tandem walk adequately. Tremor: none  IMPRESSION: This is a very pleasant 46 year old right-handed woman with a history of positional vertigo lasting for months at a time, and 2 episodes of transient left-sided symptoms. She reports being told 10 years ago with the first episode of left-sided numbness/weakness that there was concern for MS based on brain MRI, but she refused spinal tap. She had another episode of left leg heaviness last July that only lasted for 10 seconds, which is unusual for an MS flare. The etiology of her symptoms is unclear, MRI brain with and without contrast will be ordered to assess for underlying structural abnormality. Seizures are considered, but less likely, routine EEG will be done. If MRI brain shows findings concerning for demyelination, we will order an MRI cervical and thoracic spine. She will discuss low vitamin D level with her PCP. She will follow-up after the tests and knows to call for any changes.  Thank you for allowing me to participate in the care of this patient. Please do not hesitate to call for any questions or concerns.   Ellouise Newer, M.D.  CC: Dr. Shawna Riley

## 2017-08-11 ENCOUNTER — Ambulatory Visit (INDEPENDENT_AMBULATORY_CARE_PROVIDER_SITE_OTHER): Payer: BLUE CROSS/BLUE SHIELD | Admitting: Neurology

## 2017-08-11 DIAGNOSIS — R42 Dizziness and giddiness: Secondary | ICD-10-CM

## 2017-08-11 DIAGNOSIS — R413 Other amnesia: Secondary | ICD-10-CM

## 2017-08-11 DIAGNOSIS — R531 Weakness: Secondary | ICD-10-CM | POA: Diagnosis not present

## 2017-08-16 ENCOUNTER — Other Ambulatory Visit: Payer: Self-pay

## 2017-08-16 ENCOUNTER — Telehealth: Payer: Self-pay | Admitting: Neurology

## 2017-08-16 DIAGNOSIS — R42 Dizziness and giddiness: Secondary | ICD-10-CM

## 2017-08-16 NOTE — Telephone Encounter (Signed)
Patient has not heard from [facility] regarding setting up her MRI? Please Advise. Thanks

## 2017-08-16 NOTE — Telephone Encounter (Signed)
Orders resent

## 2017-08-17 NOTE — Procedures (Signed)
ELECTROENCEPHALOGRAM REPORT  Date of Study: 08/11/2017  Patient's Name: Brittney Riley MRN: [id number] Date of Birth: 1971/11/07  Referring Provider: Dr. Ellouise Newer  Clinical History: This is a 46 year old woman with transient episodes of left-sided symptoms.  Medications: none  Technical Summary: A multichannel digital EEG recording measured by the international 10-20 system with electrodes applied with paste and impedances below 5000 ohms performed in our laboratory with EKG monitoring in an awake and asleep patient.  Hyperventilation and photic stimulation were performed.  The digital EEG was referentially recorded, reformatted, and digitally filtered in a variety of bipolar and referential montages for optimal display.    Description: The patient is awake and asleep during the recording.  During maximal wakefulness, there is a symmetric, medium voltage 11 Hz posterior dominant rhythm that attenuates with eye opening.  The record is symmetric.  During drowsiness and sleep, there is an increase in theta slowing of the background, with shifting asymmetry over the bilateral temporal regions. Occasional vertex waves were seen.  Hyperventilation and photic stimulation did not elicit any abnormalities.  There were no epileptiform discharges or electrographic seizures seen.    EKG lead was unremarkable.  Impression: This awake and asleep EEG is normal.    Clinical Correlation: A normal EEG does not exclude a clinical diagnosis of epilepsy.  If further clinical questions remain, prolonged EEG may be helpful.  Clinical correlation is advised.   Ellouise Newer, M.D.

## 2017-08-23 ENCOUNTER — Telehealth: Payer: Self-pay | Admitting: Neurology

## 2017-08-23 ENCOUNTER — Other Ambulatory Visit: Payer: Self-pay

## 2017-08-23 MED ORDER — DIAZEPAM 5 MG PO TABS: ORAL_TABLET | ORAL | 0 refills | Status: DC

## 2017-08-23 NOTE — Telephone Encounter (Signed)
Rx faxed to pharmacy listed below.    Valium 5mg , #2 no refills.

## 2017-08-23 NOTE — Telephone Encounter (Signed)
Patient is having an MRI on Thursday 9/27 at 2:30. She is needing to have something called in for her to take while having the MRI. She uses [facility] on [address]. Please Call. Thanks

## 2017-09-01 ENCOUNTER — Telehealth: Payer: Self-pay | Admitting: Neurology

## 2017-09-01 ENCOUNTER — Other Ambulatory Visit: Payer: Self-pay

## 2017-09-01 DIAGNOSIS — R42 Dizziness and giddiness: Secondary | ICD-10-CM

## 2017-09-01 NOTE — Telephone Encounter (Signed)
Orders for MRI's faxed to [facility].

## 2017-09-01 NOTE — Telephone Encounter (Signed)
Discussed MRI results with patient. There is still concern for MS, would do more tests, start with MRI cervical and thoracic spine with and without contrast.  Brittney Riley, she will again need open MRI, pls order and will need Valium again. Thanks

## 2017-09-07 ENCOUNTER — Telehealth: Payer: Self-pay | Admitting: Family Medicine

## 2017-09-07 NOTE — Telephone Encounter (Signed)
Patient's daughter dropped disk off for mom from [facility]. Put in Dr Lora Havens box

## 2017-09-07 NOTE — Telephone Encounter (Signed)
Called patient. Disc was accidentally dropped off here, was supposed to go to [facility]. Will place up front for pick up.

## 2017-09-15 ENCOUNTER — Ambulatory Visit: Payer: BLUE CROSS/BLUE SHIELD | Admitting: Neurology

## 2017-09-22 ENCOUNTER — Telehealth: Payer: Self-pay | Admitting: Neurology

## 2017-09-22 NOTE — Telephone Encounter (Signed)
I don't see the results on EPIC. Pls let her know the MRI of the cervical and thoracic spine did not show any evidence of MS. If we would like to pursue this further, we had previously discussed doing a spinal tap. Does she want to do this or see me in the office first? Can use an EEG slot for a visit. Thanks

## 2017-09-22 NOTE — Telephone Encounter (Signed)
Patient calling in for test results. She said the results were sent to her through My Chart and the MRI was through [facility]. She is unable to Interpret them. Thanks

## 2017-09-22 NOTE — Telephone Encounter (Signed)
Spoke with pt relaying message below. She had a copy of her report, and had googled what the report said.  She states that per her "research" it says that there is still possibility of MS.  Specifically T-2 hypointense to the spleen.  I advised that she speak with her PCP about this, as Dr. Delice Lesch is sure that pt does not have MS via disc images.  She became frustrated and said "I Just thought that the dr who wanted the MRI would be the one to tell me the results" I let her know that I had just explained Dr. Amparo Bristol results to her.  She did not seem too happy with the results, even though they are normal, and stated that she will contact her PCP for further explanation.  I did apologize that I was not able to answer all of her questions, but I do not have the authority (nor knowledge) to interpret reports. She verbalized understanding and also apologized for getting frustrated with me.

## 2017-10-20 ENCOUNTER — Ambulatory Visit: Payer: BLUE CROSS/BLUE SHIELD | Admitting: Family Medicine

## 2017-10-20 ENCOUNTER — Encounter: Payer: Self-pay | Admitting: Family Medicine

## 2017-10-20 VITALS — BP 118/60 | HR 62 | Temp 98.0°F | Ht 66.0 in | Wt 182.0 lb

## 2017-10-20 DIAGNOSIS — G5603 Carpal tunnel syndrome, bilateral upper limbs: Secondary | ICD-10-CM | POA: Diagnosis not present

## 2017-10-20 DIAGNOSIS — E559 Vitamin D deficiency, unspecified: Secondary | ICD-10-CM

## 2017-10-20 DIAGNOSIS — B353 Tinea pedis: Secondary | ICD-10-CM | POA: Insufficient documentation

## 2017-10-20 DIAGNOSIS — M5030 Other cervical disc degeneration, unspecified cervical region: Secondary | ICD-10-CM

## 2017-10-20 MED ORDER — VITAMIN D 1000 UNITS PO TABS: 1000.0000 [IU] | ORAL_TABLET | Freq: Every day | ORAL | 6 refills | Status: DC

## 2017-10-20 MED ORDER — TERBINAFINE HCL 250 MG PO TABS: 250.0000 mg | ORAL_TABLET | Freq: Every day | ORAL | 0 refills | Status: DC

## 2017-10-20 MED ORDER — WRIST SPLINT MISC: 0 refills | Status: DC

## 2017-10-20 MED ORDER — VITAMIN D (ERGOCALCIFEROL) 1.25 MG (50000 UNIT) PO CAPS: 50000.0000 [IU] | ORAL_CAPSULE | ORAL | 0 refills | Status: DC

## 2017-10-20 NOTE — Assessment & Plan Note (Signed)
Discussed using cock-up wrist splints nightly and re-evaluate for improvement after 2 months.

## 2017-10-20 NOTE — Progress Notes (Signed)
    Subjective:  Brittney Riley is a 46 y.o. female who presents to the [facility] today to review MRI and lab results  HPI:  Neurology appointment/MRI results - wants help interpreting MRI results,does not want a spinal tap unless necessary - no further episodes of weakness or falls that she has had in the past.  - but dose endorse some vertigo episodes  H/o lab abnormalities - has mychart and saw vitd low, would like to replete - had h/o elevated liver enzymes in the past  Fungus on L foot toenails - has been trying to keep clean and dry  - smells sometimes - scaly/peely skin on bottom of L foot. R foot is fine - no redness or drainage. No fevers or chills  Tingling in fingers - worse with typing, playing on phone, curling hair - only in fingers, mostly thumb through middle finger - no weakness or dropping things - keep flicking hands down when she has the feeling   ROS: Per HPI  PMH: Smoking history reviewed.   she reports that she quit smoking about 14 years ago. Her smoking use included cigarettes. She has a 15.00 pack-year smoking history. she has never used smokeless tobacco. She reports that she does not drink alcohol or use drugs.   Objective:  Physical Exam: BP 118/60   Pulse 62   Temp 98 F (36.7 C) (Oral)   Ht 5\' 6"  (1.676 m)   Wt 182 lb (82.6 kg)   SpO2 99%   BMI 29.38 kg/m   Gen: NAD, resting comfortably MSK: 5/5 grip strength, < 3 sec cap refill, sensation intact in both hands.  Skin: warm, dry. Scaly hyperkeratotic skin on L foot in moccasin distribution. Toenail painted and not able to assess fully. Neuro: grossly normal, moves all extremities Psych: Normal affect and thought content   Assessment/Plan:  DDD (degenerative disc disease), cervical Discussed that MRI showed mild disc bulge at C5-6 that is likely asymptomatic vs causing mild symptoms that should easily be managed at home with stretches and NSAIDs. Patient reassured and voiced good  understanding of this. Also discussed that per neurology notes, that no concern for MS. Patient agreed that spinal tap would not be in her best interest.  Vitamin D deficiency Replete with vitD 50,000U weekly for 6 weeks followed by daily 1000U. Recheck in 6-8 weeks.   Tinea pedis of left foot Treat with terbinafine 250mg  for 2 weeks. Discussed return to clinic if no improvement. If does not resolve then will need to evaluate for onchymycosis which would required a prolonged 6 to 12 weeks of terbinafine.  Bilateral carpal tunnel syndrome Discussed using cock-up wrist splints nightly and re-evaluate for improvement after 2 months.  Health maintenance  - Of note, patient was also concerned over liver finding "T hypointense compared to spleen" which is most likely fatty liver and was therefore reassured given recent normal LFTs from August and counseled to eat a low fat diet and exercise regularly. - UTD on pap, done at [facility] office - UTD colonoscopy, due in 2021 - UTD on TDAP, flu  Bufford Lope, DO PGY-2, [facility] 10/20/2017 11:45 AM

## 2017-10-20 NOTE — Patient Instructions (Addendum)
It was good to see you today!  For your vit D deficiency,  - 50,000U tablet once weekly for 6 weeks, then take 1000U daily (the over the counter version) - recheck vit D level in 6-8 weeks  For carpal tunnel  - [facility] [address], [place], [place] [address]. Phone [phone] - wear wrist splints nightly - follow up in 2 months  For athletes foot - Terbinafine 250 mg per day for two weeks  Please check-out at the front desk before leaving the clinic. Make an appointment in  2 months for carpal tunnel follow up.   Please bring all of your medications with you to each visit.    Feel free to call with any questions or concerns at any time, at [phone].   Take care,  Dr. Bufford Lope, [place]   Athlete's Foot Athlete's foot (tinea pedis) is a fungal infection of the skin on the feet. It often occurs on the skin that is between or underneath the toes. It can also occur on the soles of the feet. The infection can spread from person to person (is contagious). What are the causes? Athlete's foot is caused by a fungus. This fungus grows in warm, moist places. Most people get athlete's foot by sharing shower stalls, towels, and wet floors with someone who is infected. Not washing your feet or changing your socks often enough can contribute to athlete's foot. What increases the risk? This condition is more likely to develop in:  Men.  People who have a weak body defense system (immune system).  People who have diabetes.  People who use public showers, such as at a gym.  People who wear heavy-duty shoes, such as Environmental manager.  Seasons with warm, humid weather.  What are the signs or symptoms? Symptoms of this condition include:  Itchy areas between the toes or on the soles of the feet.  White, flaky, or scaly areas between the toes or on the soles of the feet.  Very itchy small blisters between the toes or  on the soles of the feet.  Small cuts on the skin. These cuts can become infected.  Thick or discolored toenails.  How is this diagnosed? This condition is diagnosed with a medical history and physical exam. Your health care provider may also take a skin or toenail sample to be examined. How is this treated? Treatment for this condition includes antifungal medicines. These may be applied as powders, ointments, or creams. In severe cases, an oral antifungal medicine may be given. Follow these instructions at home:  Apply or take over-the-counter and prescription medicines only as told by your health care provider.  Keep all follow-up visits as told by your health care provider. This is important.  Do not scratch your feet.  Keep your feet dry: ? Wear cotton or wool socks. Change your socks every day or if they become wet. ? Wear shoes that allow air to circulate, such as sandals or canvas tennis shoes.  Wash and dry your feet: ? Every day or as told by your health care provider. ? After exercising. ? Including the area between your toes.  Do not share towels, nail clippers, or other personal items that touch your feet with others.  If you have diabetes, keep your blood sugar under control. How is this prevented?  Do not share towels.  Wear sandals in wet areas, such as locker rooms and shared showers.  Keep  your feet dry: ? Wear cotton or wool socks. Change your socks every day or if they become wet. ? Wear shoes that allow air to circulate, such as sandals or canvas tennis shoes.  Wash and dry your feet after exercising. Pay attention to the area between your toes. Contact a health care provider if:  You have a fever.  You have swelling, soreness, warmth, or redness in your foot.  You are not getting better with treatment.  Your symptoms get worse.  You have new symptoms. This information is not intended to replace advice given to you by your health care provider.  Make sure you discuss any questions you have with your health care provider. Document Released: 11/13/2000 Document Revised: 04/23/2016 Document Reviewed: 05/20/2015 Elsevier Interactive Patient Education  2018 [place] Syndrome Carpal tunnel syndrome is a condition that causes pain in your hand and arm. The carpal tunnel is a narrow area that is on the palm side of your wrist. Repeated wrist motion or certain diseases may cause swelling in the tunnel. This swelling can pinch the main nerve in the wrist (median nerve). Follow these instructions at home: If you have a splint:  Wear it as told by your doctor. Remove it only as told by your doctor.  Loosen the splint if your fingers: ? Become numb and tingle. ? Turn blue and cold.  Keep the splint clean and dry. General instructions  Take over-the-counter and prescription medicines only as told by your doctor.  Rest your wrist from any activity that may be causing your pain. If needed, talk to your employer about changes that can be made in your work, such as getting a wrist pad to use while typing.  If directed, apply ice to the painful area: ? Put ice in a plastic bag. ? Place a towel between your skin and the bag. ? Leave the ice on for 20 minutes, 2-3 times per day.  Keep all follow-up visits as told by your doctor. This is important.  Do any exercises as told by your doctor, physical therapist, or occupational therapist. Contact a doctor if:  You have new symptoms.  Medicine does not help your pain.  Your symptoms get worse. This information is not intended to replace advice given to you by your health care provider. Make sure you discuss any questions you have with your health care provider. Document Released: 11/05/2011 Document Revised: 04/23/2016 Document Reviewed: 04/03/2015 Elsevier Interactive Patient Education  [employer].

## 2017-10-20 NOTE — Assessment & Plan Note (Signed)
Replete with vitD 50,000U weekly for 6 weeks followed by daily 1000U. Recheck in 6-8 weeks.

## 2017-10-20 NOTE — Assessment & Plan Note (Addendum)
Discussed that MRI showed mild disc bulge at C5-6 that is likely asymptomatic vs causing mild symptoms that should easily be managed at home with stretches and NSAIDs. Patient reassured and voiced good understanding of this. Also discussed that per neurology notes, that no concern for MS. Patient agreed that spinal tap would not be in her best interest.

## 2017-10-20 NOTE — Assessment & Plan Note (Signed)
Treat with terbinafine 250mg  for 2 weeks. Discussed return to clinic if no improvement. If does not resolve then will need to evaluate for onchymycosis which would required a prolonged 6 to 12 weeks of terbinafine.

## 2017-10-29 ENCOUNTER — Ambulatory Visit: Payer: BLUE CROSS/BLUE SHIELD | Admitting: Family Medicine

## 2017-12-15 ENCOUNTER — Ambulatory Visit: Payer: BLUE CROSS/BLUE SHIELD | Admitting: Family Medicine

## 2017-12-15 ENCOUNTER — Other Ambulatory Visit: Payer: Self-pay

## 2017-12-15 ENCOUNTER — Encounter: Payer: Self-pay | Admitting: Family Medicine

## 2017-12-15 VITALS — BP 112/60 | HR 84 | Temp 98.0°F | Ht 66.0 in | Wt 182.0 lb

## 2017-12-15 DIAGNOSIS — E559 Vitamin D deficiency, unspecified: Secondary | ICD-10-CM

## 2017-12-15 DIAGNOSIS — R531 Weakness: Secondary | ICD-10-CM | POA: Diagnosis not present

## 2017-12-15 MED ORDER — CETIRIZINE HCL 10 MG PO CAPS: 10.0000 mg | ORAL_CAPSULE | Freq: Every day | ORAL | 11 refills | Status: DC

## 2017-12-15 NOTE — Assessment & Plan Note (Signed)
  Will assess vitamin D levels today.

## 2017-12-15 NOTE — Progress Notes (Signed)
    Subjective:    Patient ID: Brittney Riley, female    DOB: [birth year], 47 y.o.   MRN: [id number]   CC: dizziness  Patient reports a long standing history of "dizzy spells" where she feels generally unwell and weak.. She is going through one of those spells today that is worse than usual. She reports feeling unsteady on her feet like she will fall over. She denies sensation of room spinning. She has tried meclizine in the past with no relief so she stopped taking it. She reports seeing a specialist for this issue who did some table tilting test and gave her exercises to do but they did not help her. She has had several MRI's of her head with no findings to explain symptoms. She denies fevers, chills, appetite changes, falls, focal weakness. She describes feeling very weak and fatigued without energy to go about her day when these events occur.   She is also taking vitamin D - completed 50k units x 6 weeks. She is to be taking 1000 units daily but has not started this yet. Would like to know what her levels are at.   Smoking status reviewed- former smoker  Review of Systems- see HPI, additionally, denies symptoms of depression or anxiety   Objective:  BP 112/60   Pulse 84   Temp 98 F (36.7 C) (Oral)   Ht 5\' 6"  (1.676 m)   Wt 182 lb (82.6 kg)   BMI 29.38 kg/m  Vitals and nursing note reviewed  General: well nourished, in no acute distress HEENT: normocephalic, TM's visualized bilaterally- right appears full with fluid behind TM. Left TM normal. No scleral icterus or conjunctival pallor, no nasal discharge, moist mucous membranes, good dentition without erythema or discharge noted in posterior oropharynx Neck: supple, non-tender, without lymphadenopathy Cardiac: RRR, clear S1 and S2, no murmurs, rubs, or gallops Respiratory: clear to auscultation bilaterally, no increased work of breathing Extremities: no edema or cyanosis. Neuro: alert and oriented, no focal deficits. CN 2-12 in  tact. Cerebellar testing normal (finger to nose and heel to shin testing). Negative romberg. Strength 5/5 in upper and lower extremities bilaterally. Normal gait.    Assessment & Plan:    Weakness  Patient history not consistent with vertigo. No focal or concerning neuro exam findings. No decreased hearing or tinnitus. Patient established with neurology and has had extensive work up (short of LP) which has been negative. Unclear etiology for her symptoms. 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   -rx for cetirizine given to help decrease fluid in ears -will check TSH, CBC to rule out thyroid dysfunction or anemia as cause for weakness -follow up with PCP once returns from trip if symptoms persist -Patient verbalized understanding and agreement with plan.   Vitamin D deficiency  Will assess vitamin D levels today.    Return if symptoms worsen or fail to improve.   Lucila Maine, DO Family Medicine Resident PGY-2

## 2017-12-15 NOTE — Patient Instructions (Signed)
   It was nice to meet you today! Sorry you're not feeling well.  Let's try a daily antihistamine to see if this helps with fluid build up in the ears.  Please follow up with your PCP if this dizzy sensation persists.  I hope you have a nice time on your cruise!  If you have questions or concerns please do not hesitate to call at [phone].  Lucila Maine, DO PGY-2, [facility] 12/15/2017 2:29 PM

## 2017-12-15 NOTE — Assessment & Plan Note (Signed)
  Patient history not consistent with vertigo. No focal or concerning neuro exam findings. No decreased hearing or tinnitus. Patient established with neurology and has had extensive work up (short of LP) which has been negative. Unclear etiology for her symptoms. 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   -rx for cetirizine given to help decrease fluid in ears -will check TSH, CBC to rule out thyroid dysfunction or anemia as cause for weakness -follow up with PCP once returns from trip if symptoms persist -Patient verbalized understanding and agreement with plan.

## 2017-12-16 LAB — CBC
HEMATOCRIT: 40.3 % (ref 34.0–46.6)
HEMOGLOBIN: 13.6 g/dL (ref 11.1–15.9)
MCH: 29.4 pg (ref 26.6–33.0)
MCHC: 33.7 g/dL (ref 31.5–35.7)
MCV: 87 fL (ref 79–97)
Platelets: 217 10*3/uL (ref 150–379)
RBC: 4.62 x10E6/uL (ref 3.77–5.28)
RDW: 13.4 % (ref 12.3–15.4)
WBC: 5.8 10*3/uL (ref 3.4–10.8)

## 2017-12-16 LAB — TSH: TSH: 1.34 u[IU]/mL (ref 0.450–4.500)

## 2017-12-16 LAB — VITAMIN D 25 HYDROXY (VIT D DEFICIENCY, FRACTURES): VIT D 25 HYDROXY: 41.5 ng/mL (ref 30.0–100.0)

## 2017-12-20 ENCOUNTER — Telehealth: Payer: Self-pay | Admitting: *Deleted

## 2017-12-20 ENCOUNTER — Encounter: Payer: Self-pay | Admitting: *Deleted

## 2017-12-20 NOTE — Telephone Encounter (Signed)
-----   Message from Steve Rattler, DO sent at 12/20/2017  8:56 AM EST ----- Labs reviewed and normal.

## 2017-12-20 NOTE — Telephone Encounter (Signed)
Letter mailed and sent to mychart. Jazmin Hartsell,CMA

## 2018-03-01 ENCOUNTER — Other Ambulatory Visit: Payer: Self-pay | Admitting: Family Medicine

## 2018-03-01 DIAGNOSIS — Z1231 Encounter for screening mammogram for malignant neoplasm of breast: Secondary | ICD-10-CM

## 2018-03-17 ENCOUNTER — Ambulatory Visit
Admission: RE | Admit: 2018-03-17 | Discharge: 2018-03-17 | Disposition: A | Discharge status: Home / Self Care | Payer: BLUE CROSS/BLUE SHIELD | Source: Ambulatory Visit | Attending: Family Medicine | Admitting: Family Medicine

## 2018-03-17 DIAGNOSIS — Z1231 Encounter for screening mammogram for malignant neoplasm of breast: Secondary | ICD-10-CM

## 2018-03-21 ENCOUNTER — Encounter: Payer: Self-pay | Admitting: Family Medicine

## 2018-03-21 ENCOUNTER — Ambulatory Visit (INDEPENDENT_AMBULATORY_CARE_PROVIDER_SITE_OTHER): Payer: BLUE CROSS/BLUE SHIELD | Admitting: Family Medicine

## 2018-03-21 ENCOUNTER — Other Ambulatory Visit: Payer: Self-pay

## 2018-03-21 VITALS — BP 110/80 | HR 73 | Temp 98.8°F | Ht 66.0 in | Wt 174.4 lb

## 2018-03-21 DIAGNOSIS — R05 Cough: Secondary | ICD-10-CM | POA: Diagnosis not present

## 2018-03-21 DIAGNOSIS — R059 Cough, unspecified: Secondary | ICD-10-CM

## 2018-03-21 DIAGNOSIS — H60391 Other infective otitis externa, right ear: Secondary | ICD-10-CM | POA: Diagnosis not present

## 2018-03-21 DIAGNOSIS — H6123 Impacted cerumen, bilateral: Secondary | ICD-10-CM

## 2018-03-21 NOTE — Assessment & Plan Note (Signed)
Erythematous canal visible after curetting impacted cerumen.  Start using prior Rx'd ear drops in R ear (was only using in left ear)

## 2018-03-21 NOTE — Progress Notes (Signed)
    Subjective:  Brittney Riley is a 47 y.o. female who presents to the [facility] today with a chief complaint of cough.   Cough is mild and has been present about 1 wk.  It is non-productive with no hemoptysis.  No fever symptoms, swallowing concerns or trouble respiratigin.  No N/V.   There was some LAD ~2wks ago with otitis media but that has resolved.    Patient also complaining of persisitent "itching" in ears since otitis media diagnosis and Rx of augmentin and abx ear drops.  She has no hearing changes, uses a qtip in her ear regularly and has only been using the drops in her left ear because that's where she was told the ear infection was.   Objective:  Physical Exam: BP 110/80   Pulse 73   Temp 98.8 F (37.1 C) (Oral)   Ht 5\' 6"  (1.676 m)   Wt 174 lb 6.4 oz (79.1 kg)   SpO2 99%   BMI 28.15 kg/m   Gen: NAD, resting comfortably Ears: Left TM clear with prior scarring likely from tympanostomy tube in childhood.  Right TM clear but with erythema in canal consistent with otitis externa.  No mastoid tenderness/erythema Throat: no LAD/ROM deficits, no drooling/stridor Pulm: NWOB, CTAB with no crackles, wheezes, or rhonchi MSK: no edema, cyanosis, or clubbing noted Skin: warm, dry Neuro: grossly normal, moves all extremities Psych: Normal affect and thought content  No results found for this or any previous visit (from the past 72 hour(s)).   Assessment/Plan:  Cough Dry intermittent cough, with mildly scratchy voice.  No LAD s/p augmentin course for otitis media.  No concern for oxygenation/drooling/swallowing.  No sensation of swelling.  Lungs CTAB. No fever symtpoms, no changes in weight, no hemoptysis.  Well appearing  Likely viral etiology.  OTC cough syrup for symptoms and return if worsening or not resolving within 1 week.  Bilateral impacted cerumen Patient with recent hx of otitis media treated [facility], cerumen impacted on presentation but is  painless, will flush and re-evaluate.  No pain now or prior to [facility], only "itching".  No hearing changes  Infective otitis externa of right ear Erythematous canal visible after curetting impacted cerumen.  Start using prior Rx'd ear drops in R ear (was only using in left ear)   Sherene Sires, [place] - PGY1 03/21/2018 11:48 AM

## 2018-03-21 NOTE — Patient Instructions (Signed)
It was a pleasure to see you today! Thank you for choosing [facility] for your primary care. Brittney Riley was seen for cough. Come back to the clinic if your symptoms get worse or do not start resolving in the next week, and go to the emergency room if you have any concern for your ability to breath/eat.  Your cough symptoms appear to be viral as you are otherwise doing quite well.  We recommend over the counter cough syrups, warm tea w/ honey for symptoms.  We flushed your ears to get a better look at them since you recently had an ear infection diagnosed and found that you have an external ear infection on the right side.  Please start using the antibiotic drops in your right ear too.    If we did any lab work today, and the results require attention, either me or my nurse will get in touch with you. If everything is normal, you will get a letter in mail and a message via . If you don't hear from [place] in two weeks, please give [place] a call. Otherwise, we look forward to seeing you again at your next visit. If you have any questions or concerns before then, please call the clinic at [phone].  Please bring all your medications to every doctors visit  Sign up for My Chart to have easy access to your labs results, and communication with your Primary care physician.    Please check-out at the front desk before leaving the clinic.    Best,  Dr. Sherene Sires FAMILY MEDICINE RESIDENT - PGY1 03/21/2018 11:01 AM

## 2018-03-21 NOTE — Assessment & Plan Note (Signed)
Patient with recent hx of otitis media [facility], cerumen impacted on presentation but is painless, will flush and re-evaluate.  No pain now or prior to [facility], only "itching".  No hearing changes

## 2018-03-21 NOTE — Assessment & Plan Note (Signed)
Dry intermittent cough, with mildly scratchy voice.  No LAD s/p augmentin course for otitis media.  No concern for oxygenation/drooling/swallowing.  No sensation of swelling.  Lungs CTAB. No fever symtpoms, no changes in weight, no hemoptysis.  Well appearing  Likely viral etiology.  OTC cough syrup for symptoms and return if worsening or not resolving within 1 week.

## 2019-01-26 ENCOUNTER — Encounter: Payer: Self-pay | Admitting: Family Medicine

## 2019-01-26 ENCOUNTER — Ambulatory Visit: Payer: BLUE CROSS/BLUE SHIELD | Admitting: Family Medicine

## 2019-01-26 ENCOUNTER — Other Ambulatory Visit: Payer: Self-pay

## 2019-01-26 ENCOUNTER — Ambulatory Visit
Admission: RE | Admit: 2019-01-26 | Discharge: 2019-01-26 | Disposition: A | Discharge status: Home / Self Care | Payer: BLUE CROSS/BLUE SHIELD | Source: Ambulatory Visit | Attending: Family Medicine | Admitting: Family Medicine

## 2019-01-26 VITALS — BP 124/72 | HR 63 | Temp 98.2°F | Ht 66.0 in | Wt 169.8 lb

## 2019-01-26 DIAGNOSIS — M545 Low back pain, unspecified: Secondary | ICD-10-CM

## 2019-01-26 DIAGNOSIS — M79605 Pain in left leg: Secondary | ICD-10-CM | POA: Diagnosis not present

## 2019-01-26 DIAGNOSIS — R300 Dysuria: Secondary | ICD-10-CM

## 2019-01-26 LAB — POCT URINALYSIS DIP (MANUAL ENTRY)
Glucose, UA: NEGATIVE mg/dL
Ketones, POC UA: NEGATIVE mg/dL
Leukocytes, UA: NEGATIVE
Nitrite, UA: NEGATIVE
PH UA: 6 (ref 5.0–8.0)
Urobilinogen, UA: 0.2 E.U./dL

## 2019-01-26 LAB — POCT UA - MICROSCOPIC ONLY

## 2019-01-26 NOTE — Patient Instructions (Addendum)
Please have your gynecologist send [place] a copy of the exam and pap smear results. The low back pain is almost certainly some variation of degenerative disc disease, facet arthritis and/or sacroiliitis.  Watch that hip pain, you may also have some early left hip arthrits. I will call with x ray results.  Plane x rays will give [place] some information - but may not tell the full story.  The urine looks clean, no antibiotics needed.

## 2019-01-26 NOTE — Assessment & Plan Note (Addendum)
Radiates to left hip.  Hip arthritis?  Sacroiliitis? Doubt true radiculopathy. Clearly musculoskeletal.  OTC NSAIDs Xrays are reassuring.

## 2019-01-27 DIAGNOSIS — R300 Dysuria: Secondary | ICD-10-CM | POA: Insufficient documentation

## 2019-01-27 NOTE — Assessment & Plan Note (Signed)
No evidence of UTI

## 2019-01-27 NOTE — Progress Notes (Signed)
Established Patient Office Visit  Subjective:  Patient ID: Brittney Riley, female    DOB: 13-May-1971  Age: 48 y.o. MRN: [id number]  CC:  Chief Complaint  Patient presents with  . Back Pain    HPI Brittney Riley presents for Left back and hip pain.   1. Left lower back pain radiating to left hip.  Unclear what brought it on.  Worse with movement.  No previous diagnosis of low back arthritis or DDD in lumbar spine but dose have cervical DDD.  No numbness or weakness.  Normal bowel and bladder.  Mom did have cancer which spread to bone - this is in the back of her mind. 2. Dysuria?  Patient states she gets intermitant dysura.  Not today.  Had over [place] when she also had a "stomach bug." Those sx have resolved.  Past Medical History:  Diagnosis Date  . 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 Vertigo   . Vitamin B deficiency   . Vitamin D deficiency     Past Surgical History:  Procedure Laterality Date  . ABDOMINAL HYSTERECTOMY  2012  . APPENDECTOMY  1986  . BREAST BIOPSY Left 2016  . [place], 2002  . HERNIA REPAIR  8841   umbilical  . OVARIAN CYST REMOVAL  1993    Family History  Problem Relation Age of Onset  . Colon cancer Mother        dx late 52's  . Colon polyps Mother        many, cancerous  . Diabetes Mother   . Depression Mother   . Hypercholesterolemia Mother   . Hypertension Mother   . Liver cancer Mother   . Colon cancer Maternal Uncle   . Breast cancer Paternal Aunt   . Liver cancer Maternal Aunt   . Colon cancer Maternal Aunt   . Alcohol abuse Father   . Drug abuse Brother   . Alcohol abuse Brother   . Depression Brother   . Drug abuse Brother   . Alcohol abuse Brother   . Depression Brother     Social History   Socioeconomic History  . Marital status: Married    Spouse name: Not on file  . Number of children: 2  . Years of education: Not on file  . Highest education level: Not on file  Occupational History  .  Occupation: [employer] Manager  Social Needs  . Financial resource strain: Not on file  . Food insecurity:    Worry: Not on file    Inability: Not on file  . Transportation needs:    Medical: Not on file    Non-medical: Not on file  Tobacco Use  . Smoking status: Former Smoker    Packs/day: 1.00    Years: 15.00    Pack years: 15.00    Types: Cigarettes    Last attempt to quit: 05/01/2003    Years since quitting: 15.7  . Smokeless tobacco: Never Used  Substance and Sexual Activity  . Alcohol use: No    Alcohol/week: 0.0 standard drinks  . Drug use: No  . Sexual activity: Yes    Partners: Male  Lifestyle  . Physical activity:    Days per week: Not on file    Minutes per session: Not on file  . Stress: Not on file  Relationships  . Social connections:    Talks on phone: Not on file    Gets together: Not on file  Attends religious service: Not on file    Active member of club or organization: Not on file    Attends meetings of clubs or organizations: Not on file    Relationship status: Not on file  . Intimate partner violence:    Fear of current or ex partner: Not on file    Emotionally abused: Not on file    Physically abused: Not on file    Forced sexual activity: Not on file  Other Topics Concern  . Not on file  Social History Narrative   Lives with Elberta Fortis (husband), Roswell Miners (daughter), Kathlyn [place] (son), Langston Reusing (grandson), 3 dogs in 1 level home.   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 calcium-vitamin D (OSCAL WITH D) 500-200 MG-UNIT tablet Take 1 tablet by mouth.    . Cetirizine HCl 10 MG CAPS Take 1 capsule (10 mg total) by mouth at bedtime. 30 capsule 11  . cholecalciferol (VITAMIN D) 1000 units tablet Take 1 tablet (1,000 Units total) by mouth daily. 30 tablet 6  . diazepam (VALIUM) 5 MG tablet Take one tablet 30 minutes prior to MRI.  If needed take second tablet immediately prior to  MRI 2 tablet 0  . Elastic Bandages & Supports (WRIST SPLINT) MISC As directed 2 each 0  . terbinafine (LAMISIL) 250 MG tablet Take 1 tablet (250 mg total) by mouth daily. 14 tablet 0  . Vitamin D, Ergocalciferol, (DRISDOL) 50000 units CAPS capsule Take 1 capsule (50,000 Units total) by mouth every 7 (seven) days. 6 capsule 0   No facility-administered medications prior to visit.     Allergies  Allergen Reactions  . Doxycycline Other (See Comments)    Throat closed up  . Levofloxacin Itching and Rash    Itching all over - per patient  . Meclizine Other (See Comments)    Patient reports made her groggy    ROS Review of Systems    Objective:    Physical Exam  BP 124/72   Pulse 63   Temp 98.2 F (36.8 C) (Oral)   Ht 5\' 6"  (1.676 m)   Wt 169 lb 12.8 oz (77 kg)   SpO2 99%   BMI 27.41 kg/m  Wt Readings from Last 3 Encounters:  01/26/19 169 lb 12.8 oz (77 kg)  03/21/18 174 lb 6.4 oz (79.1 kg)  12/15/17 182 lb (82.6 kg)   Abd benign Pain is mostly below the left iliac crest.  Good ROM of lumbar spine without paraspinous muscle spasm. Left hip, some discomfort to extremes of internal and external rotation.  Health Maintenance Due  Topic Date Due  . HIV Screening  02/07/1984  . PAP SMEAR-Modifier  02/06/1990    There are no preventive care reminders to display for this patient.  Lab Results  Component Value Date   TSH 1.340 12/15/2017   Lab Results  Component Value Date   WBC 5.8 12/15/2017   HGB 13.6 12/15/2017   HCT 40.3 12/15/2017   MCV 87 12/15/2017   PLT 217 12/15/2017   Lab Results  Component Value Date   NA 139 07/13/2017   K 4.7 07/13/2017   CO2 22 07/13/2017   GLUCOSE 87 07/13/2017   BUN 22 07/13/2017   CREATININE 0.74 07/13/2017   BILITOT 0.3 07/13/2017   ALKPHOS 79 07/13/2017   AST 23 07/13/2017   ALT 29 07/13/2017   PROT 6.7 07/13/2017  ALBUMIN 4.3 07/13/2017   CALCIUM 9.2 07/13/2017   GFR 85.27 06/17/2016   Lab Results  Component  Value Date   CHOL 220 (H) 07/13/2017   Lab Results  Component Value Date   HDL 51 07/13/2017   Lab Results  Component Value Date   LDLCALC 156 (H) 07/13/2017   Lab Results  Component Value Date   TRIG 64 07/13/2017   Lab Results  Component Value Date   CHOLHDL 4.3 07/13/2017   No results found for: HGBA1C    Assessment & Plan:   Problem List Items Addressed This Visit    Low back pain radiating to left leg    Radiates to left hip.  Hip arthritis?  Sacroiliitis? Doubt true radiculopathy.      Relevant Orders   DG Lumbar Spine Complete (Completed)   DG Hip Unilat W OR W/O Pelvis 2-3 Views Left (Completed)    Other Visit Diagnoses    Dysuria    -  Primary   Relevant Orders   POCT urinalysis dipstick (Completed)   POCT UA - Microscopic Only (Completed)      No orders of the defined types were placed in this encounter.   Follow-up: No follow-ups on file.    Zenia Resides, MD

## 2019-06-30 ENCOUNTER — Other Ambulatory Visit: Payer: Self-pay | Admitting: Family Medicine

## 2019-06-30 ENCOUNTER — Other Ambulatory Visit: Payer: Self-pay | Admitting: Obstetrics and Gynecology

## 2019-06-30 DIAGNOSIS — Z1231 Encounter for screening mammogram for malignant neoplasm of breast: Secondary | ICD-10-CM

## 2019-08-21 ENCOUNTER — Other Ambulatory Visit: Payer: Self-pay

## 2019-08-21 ENCOUNTER — Telehealth: Payer: BLUE CROSS/BLUE SHIELD | Admitting: Family Medicine

## 2019-08-21 ENCOUNTER — Telehealth (INDEPENDENT_AMBULATORY_CARE_PROVIDER_SITE_OTHER): Payer: BC Managed Care – PPO | Admitting: Family Medicine

## 2019-08-21 DIAGNOSIS — R05 Cough: Secondary | ICD-10-CM

## 2019-08-21 DIAGNOSIS — E538 Deficiency of other specified B group vitamins: Secondary | ICD-10-CM

## 2019-08-21 DIAGNOSIS — E559 Vitamin D deficiency, unspecified: Secondary | ICD-10-CM | POA: Diagnosis not present

## 2019-08-21 DIAGNOSIS — E785 Hyperlipidemia, unspecified: Secondary | ICD-10-CM | POA: Diagnosis not present

## 2019-08-21 NOTE — Assessment & Plan Note (Signed)
Rechecking Vit D levels to ensure it has remained normal after stopping replacement therapy

## 2019-08-21 NOTE — Progress Notes (Signed)
[facility] Telemedicine Visit  Patient consented to have virtual visit. Method of visit: Video  Encounter participants: Patient: Brittney Riley - located at home in [facility] Provider: Nuala Alpha - located at [facility] Others (if applicable): none  Chief Complaint: Cough  HPI: Patient presents with one week of cough that began with a sore throat for one day. Her sore throat improved but her cough has continued. It is productive with clear to yellowish phelgm and is worse at night time and in the morning. She has had no fever, chills, SOB, difficulty breathing, chest pain, congestion, nausea, or vomiting. She has no known COVID exposure. She does have some fatigue and rhinorrhea associated with her cough. She states she usually gets these symptoms once or twice around this time of year. She has GERD and Allergies but takes no daily medications for them.  ROS: per HPI  Pertinent PMHx: Vit D deficiency, Vit B12 deficiency, GERD, Seasonal Allergies  Exam:  Gen: alert, healthy appearing female Respiratory: normal work of breathing, speaking in full sentences Skin: no rashes  Assessment/Plan:  Vitamin D deficiency Rechecking Vit D levels to ensure it has remained normal after stopping replacement therapy  Cough Most likely a combination of URI plus seasonal allergies. Could also be GERD contributing as well. - OTC medications to help with symptoms. - Claritin for at least 1 month to help with symptoms like post nasal drip and rhinorrhea - Recommend PPI or famotidine for GERD if symptoms don't improve    Time spent during visit with patient: >15 minutes  Harolyn Rutherford, DO [place], PGY-3

## 2019-08-21 NOTE — Assessment & Plan Note (Signed)
Most likely a combination of URI plus seasonal allergies. Could also be GERD contributing as well. - OTC medications to help with symptoms. - Claritin for at least 1 month to help with symptoms like post nasal drip and rhinorrhea - Recommend PPI or famotidine for GERD if symptoms don't improve

## 2019-09-06 NOTE — Addendum Note (Signed)
Addended by: Ezzard Flax on: 09/06/2019 01:43 PM   Modules accepted: Level of Service

## 2019-11-02 ENCOUNTER — Telehealth (INDEPENDENT_AMBULATORY_CARE_PROVIDER_SITE_OTHER): Payer: BC Managed Care – PPO | Admitting: Family Medicine

## 2019-11-02 ENCOUNTER — Other Ambulatory Visit: Payer: Self-pay

## 2019-11-02 DIAGNOSIS — Z87898 Personal history of other specified conditions: Secondary | ICD-10-CM

## 2019-11-02 DIAGNOSIS — Z20828 Contact with and (suspected) exposure to other viral communicable diseases: Secondary | ICD-10-CM

## 2019-11-02 DIAGNOSIS — Z20822 Contact with and (suspected) exposure to covid-19: Secondary | ICD-10-CM

## 2019-11-02 NOTE — Progress Notes (Signed)
[facility] Telemedicine Visit  Patient consented to have virtual visit. Method of visit: Video was attempted, but technology challenges prevented patient from using video, so visit was conducted via telephone.  Encounter participants: Patient: Brittney Riley - located at home Provider: Gerlene Fee - located at [facility] office  Chief Complaint: Episodes of dizzy spells different from her usual vertigo  HPI: Dizzy hard time breathing chest felt heavy. Feels weird and takes her breath. Is intermittent. 2 weeks ago sitting down at desk. Lasting 1 minute or less. Cant hold up her left side up lost of balance to that side. Years ago thought she had MS. MRI with white matter disease 10 years. Having a hard time remember things that are common sense and is now starting to scare her. Tired all the time feels as if tiredness could be from menopause. COVID exposure on last Thursday, thanksgiving. Son's girlfriend but son tested negative. Cervical and thoracic MRI from 2018 did show any evidence of MS according to Dr. Delice Lesch. Brittney Riley thought it was in 2010 and that it did show MS.    ROS: per HPI  Pertinent PMHx:  Patient Active Problem List   Diagnosis Date Noted  . Hx of dizziness 11/02/2019  . Low back pain radiating to left leg 01/26/2019  . Cough 03/21/2018  . Bilateral impacted cerumen 03/21/2018  . Bilateral carpal tunnel syndrome 10/20/2017  . Vitamin D deficiency 10/20/2017  . DDD (degenerative disc disease), cervical 06/15/2012    Exam:  Respiratory: Speaking in full sentence. Normal effort.  Assessment/Plan:  Hx of dizziness Due to nature of virtual visit cannot fully assess patient over phone although HPI is suggestive of possible MS, vitamin b 12 deficiency in which she has a history. Last vitamin B12 level in 2017 was within normal limits at 286. Dizziness as well as mental fog could be age related and dependent on stressors at home. She has  been seen by [facility] neurology. I encouraged her to call and make an appointment as soon as possible. Return precautions given.  -Make an appointment with [place] -Obtain vitamin B12 level at next office visit -If symptoms fail to improve or worsen continue change in mental status please go to ED  Close exposure to COVID-19 virus Discussed the need for testing and quarantining per CDC protocol.  Patient voiced reason as to not get tested because she is not symptomatic. Continued to encourage going to be tested gave testing site formation and testing times and instructions. -Referral to [facility] testing site -If develops symptoms quarantine per CDC protocol -If develops Covid symptoms and fail to improve or worsen go to ED    Time spent during visit with patient: 20 minutes

## 2019-11-05 DIAGNOSIS — Z20822 Contact with and (suspected) exposure to covid-19: Secondary | ICD-10-CM | POA: Insufficient documentation

## 2019-11-05 DIAGNOSIS — Z20828 Contact with and (suspected) exposure to other viral communicable diseases: Secondary | ICD-10-CM | POA: Insufficient documentation

## 2019-11-05 NOTE — Assessment & Plan Note (Addendum)
Due to nature of virtual visit cannot fully assess patient over phone although HPI is suggestive of possible MS, vitamin b 12 deficiency in which she has a history. Last vitamin B12 level in 2017 was within normal limits at 286. Dizziness as well as mental fog could be age related and dependent on stressors at home. She has been seen by [facility] neurology. I encouraged her to call and make an appointment as soon as possible. Return precautions given.  -Make an appointment with [place] -Obtain vitamin B12 level at next office visit -If symptoms fail to improve or worsen continue change in mental status please go to ED

## 2019-11-05 NOTE — Assessment & Plan Note (Signed)
Discussed the need for testing and quarantining per CDC protocol.  Patient voiced reason as to not get tested because she is not symptomatic. Continued to encourage going to be tested gave testing site formation and testing times and instructions. -Referral to [facility] testing site -If develops symptoms quarantine per CDC protocol -If develops Covid symptoms and fail to improve or worsen go to ED

## 2020-03-04 ENCOUNTER — Encounter: Payer: Self-pay | Admitting: Gastroenterology

## 2020-03-28 ENCOUNTER — Other Ambulatory Visit: Payer: Self-pay

## 2020-03-28 ENCOUNTER — Ambulatory Visit (AMBULATORY_SURGERY_CENTER): Payer: Self-pay

## 2020-03-28 VITALS — Temp 97.6°F | Ht 66.0 in | Wt 170.6 lb

## 2020-03-28 DIAGNOSIS — Z86010 Personal history of colonic polyps: Secondary | ICD-10-CM

## 2020-03-28 DIAGNOSIS — Z01818 Encounter for other preprocedural examination: Secondary | ICD-10-CM

## 2020-03-28 MED ORDER — NA SULFATE-K SULFATE-MG SULF 17.5-3.13-1.6 GM/177ML PO SOLN: 1.0000 | Freq: Once | ORAL | 0 refills | Status: AC

## 2020-03-28 NOTE — Progress Notes (Signed)
No allergies to soy or egg Pt is not on blood thinners or diet pills Denies issues with sedation/intubation Denies atrial flutter/fib Denies constipation   Emmi instructions given to pt  Pt is aware of Covid safety and care partner requirements.  

## 2020-04-08 ENCOUNTER — Other Ambulatory Visit: Payer: Self-pay

## 2020-04-08 ENCOUNTER — Ambulatory Visit (INDEPENDENT_AMBULATORY_CARE_PROVIDER_SITE_OTHER): Payer: BC Managed Care – PPO

## 2020-04-08 DIAGNOSIS — Z1159 Encounter for screening for other viral diseases: Secondary | ICD-10-CM

## 2020-04-09 ENCOUNTER — Encounter: Payer: Self-pay | Admitting: Gastroenterology

## 2020-04-09 ENCOUNTER — Other Ambulatory Visit: Payer: Self-pay | Admitting: Gastroenterology

## 2020-04-09 LAB — SARS CORONAVIRUS 2 (TAT 6-24 HRS): SARS Coronavirus 2: NEGATIVE

## 2020-04-11 ENCOUNTER — Ambulatory Visit (AMBULATORY_SURGERY_CENTER): Payer: BC Managed Care – PPO | Admitting: Gastroenterology

## 2020-04-11 ENCOUNTER — Other Ambulatory Visit: Payer: Self-pay

## 2020-04-11 ENCOUNTER — Encounter: Payer: Self-pay | Admitting: Gastroenterology

## 2020-04-11 VITALS — BP 137/68 | HR 56 | Temp 96.9°F | Resp 16 | Ht 66.0 in | Wt 170.0 lb

## 2020-04-11 DIAGNOSIS — Z86010 Personal history of colonic polyps: Secondary | ICD-10-CM

## 2020-04-11 DIAGNOSIS — K5140 Inflammatory polyps of colon without complications: Secondary | ICD-10-CM | POA: Diagnosis not present

## 2020-04-11 DIAGNOSIS — Z800 Family history of malignant neoplasm of digestive organs: Secondary | ICD-10-CM

## 2020-04-11 DIAGNOSIS — D120 Benign neoplasm of cecum: Secondary | ICD-10-CM

## 2020-04-11 MED ORDER — SODIUM CHLORIDE 0.9 % IV SOLN: 500.0000 mL | Freq: Once | INTRAVENOUS | Status: AC

## 2020-04-11 NOTE — Progress Notes (Signed)
Pt's states no medical or surgical changes since previsit or office visit. 

## 2020-04-11 NOTE — Progress Notes (Signed)
Called to room to assist during endoscopic procedure.  Patient ID and intended procedure confirmed with present staff. Received instructions for my participation in the procedure from the performing physician.  

## 2020-04-11 NOTE — Progress Notes (Signed)
Report to PACU, RN, vss, BBS= Clear.  

## 2020-04-11 NOTE — Patient Instructions (Signed)
HANDOUTS PROVIDED ON: POLYPS & HEMORRHOIDS  The polyp removed today have been sent for pathology.  The results can take 1-3 weeks to receive.    You may resume your previous diet and medication schedule.  Thank you for allowing [place] to care for you today!!!   YOU HAD AN ENDOSCOPIC PROCEDURE TODAY AT [facility]:   Refer to the procedure report that was given to you for any specific questions about what was found during the examination.  If the procedure report does not answer your questions, please call your gastroenterologist to clarify.  If you requested that your care partner not be given the details of your procedure findings, then the procedure report has been included in a sealed envelope for you to review at your convenience later.  YOU SHOULD EXPECT: Some feelings of bloating in the abdomen. Passage of more gas than usual.  Walking can help get rid of the air that was put into your GI tract during the procedure and reduce the bloating. If you had a lower endoscopy (such as a colonoscopy or flexible sigmoidoscopy) you may notice spotting of blood in your stool or on the toilet paper. If you underwent a bowel prep for your procedure, you may not have a normal bowel movement for a few days.  Please Note:  You might notice some irritation and congestion in your nose or some drainage.  This is from the oxygen used during your procedure.  There is no need for concern and it should clear up in a day or so.  SYMPTOMS TO REPORT IMMEDIATELY:   Following lower endoscopy (colonoscopy or flexible sigmoidoscopy):  Excessive amounts of blood in the stool  Significant tenderness or worsening of abdominal pains  Swelling of the abdomen that is new, acute  Fever of 100F or higher  For urgent or emergent issues, a gastroenterologist can be reached at any hour by calling [phone]. Do not use MyChart messaging for urgent concerns.    DIET:  We do recommend a small meal at first,  but then you may proceed to your regular diet.  Drink plenty of fluids but you should avoid alcoholic beverages for 24 hours.  ACTIVITY:  You should plan to take it easy for the rest of today and you should NOT DRIVE or use heavy machinery until tomorrow (because of the sedation medicines used during the test).    FOLLOW UP: Our staff will call the number listed on your records 48-72 hours following your procedure to check on you and address any questions or concerns that you may have regarding the information given to you following your procedure. If we do not reach you, we will leave a message.  We will attempt to reach you two times.  During this call, we will ask if you have developed any symptoms of COVID 19. If you develop any symptoms (ie: fever, flu-like symptoms, shortness of breath, cough etc.) before then, please call [phone].  If you test positive for Covid 19 in the 2 weeks post procedure, please call and report this information to [place].    If any biopsies were taken you will be contacted by phone or by letter within the next 1-3 weeks.  Please call us at [phone] if you have not heard about the biopsies in 3 weeks.    SIGNATURES/CONFIDENTIALITY: You and/or your care partner have signed paperwork which will be entered into your electronic medical record.  These signatures attest to the fact that  that the information above on your After Visit Summary has been reviewed and is understood.  Full responsibility of the confidentiality of this discharge information lies with you and/or your care-partner.

## 2020-04-15 ENCOUNTER — Telehealth: Payer: Self-pay

## 2020-04-15 NOTE — Telephone Encounter (Signed)
  Follow up Call-  Call back number 04/11/2020  Post procedure Call Back phone  # [phone]  Permission to leave phone message Yes  Some recent data might be hidden     Patient questions:  Do you have a fever, pain , or abdominal swelling? No. Pain Score  0 *  Have you tolerated food without any problems? Yes.    Have you been able to return to your normal activities? Yes.    Do you have any questions about your discharge instructions: Diet   No. Medications  No. Follow up visit  No.  Do you have questions or concerns about your Care? No.  Actions: * If pain score is 4 or above: No action needed, pain <4. 1. Have you developed a fever since your procedure? no  2.   Have you had an respiratory symptoms (SOB or cough) since your procedure? no  3.   Have you tested positive for COVID 19 since your procedure no  4.   Have you had any family members/close contacts diagnosed with the COVID 19 since your procedure?  no   If yes to any of these questions please route to Joylene John, RN and Erenest Rasher, RN

## 2020-04-25 ENCOUNTER — Encounter: Payer: Self-pay | Admitting: Gastroenterology

## 2021-01-09 NOTE — Progress Notes (Unsigned)
Patient referred by Everardo Beals, NP for palpitations  Subjective:   Brittney Riley, female    DOB: [birth year], 50 y.o.   MRN: [id number]   Chief Complaint  Patient presents with  . Palpitations  . New Patient (Initial Visit)     HPI  50 y.o. 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  She does occasional walking in the neighborhood outside of work.  She has experienced episodes of sudden onset palpitation, lasting for few seconds, associated with chest pressure.  These episodes usually occur at rest.  In addition, she is also experienced episodes of sudden onset lightheadedness, generalized weakness, and near syncope.  These episodes have occurred both at rest and during exertion such as vacuuming her house.  Such episodes last for 10-15 minutes, and are self-limiting.  Patient is a former smoker, quit in 2004.  She does not have any family history of early coronary artery disease.  She drinks 2 cups of coffee every day, does not drink any other caffeine or alcohol.  Past Medical History:  Diagnosis Date  . 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 GERD (gastroesophageal reflux disease)   . Vertigo   . Vitamin B deficiency   . Vitamin D deficiency      Past Surgical History:  Procedure Laterality Date  . ABDOMINAL HYSTERECTOMY  2012  . APPENDECTOMY  1986  . BREAST BIOPSY Left 2016  . St[place]ach, 2002  . COLONOSCOPY  2016  . HERNIA REPAIR  8413   umbilical  . OVARIAN CYST REMOVAL  1993     Social History   Tobacco Use  Smoking Status Former Smoker  . Packs/day: 1.00  . Years: 15.00  . Pack years: 15.00  . Types: Cigarettes  . Quit date: 05/01/2003  . Years since quitting: 17.7  Smokeless Tobacco Never Used    Social History   Substance and Sexual Activity  Alcohol Use No  . Alcohol/week: 0.0 standard drinks     Family History  Problem Relation Age of Onset  . Colon cancer Mother        dx  late 27's  . Colon polyps Mother        many, cancerous  . Diabetes Mother   . Depression Mother   . Hypercholesterolemia Mother   . Hypertension Mother   . Liver cancer Mother   . Colon cancer Maternal Uncle   . Breast cancer Paternal Aunt   . Liver cancer Maternal Aunt   . Colon cancer Maternal Aunt   . Alcohol abuse Father   . Drug abuse Brother   . Alcohol abuse Brother   . Depression Brother   . Drug abuse Brother   . Alcohol abuse Brother   . Depression Brother   . Colon polyps Sister 60  . Esophageal cancer Neg Hx   . Rectal cancer Neg Hx   . Stomach cancer Neg Hx      Current Outpatient Medications on File Prior to Visit  Medication Sig Dispense Refill  . fluconazole (DIFLUCAN) 150 MG tablet Take 150 mg by mouth daily.    . Multiple Vitamins-Minerals (HM MULTIVITAMIN ADULT GUMMY PO) Take by mouth.     Current Facility-Administered Medications on File Prior to Visit  Medication Dose Route Frequency Provider Last Rate Last Admin  . 0.9 %  sodium chloride infusion  500 mL Intravenous Once Ladene Artist, MD  Cardiovascular and other pertinent studies:  EKG 01/10/2021: Sinus rhythm 70 bpm Normal EKG   Recent labs: 01/03/2021: Glucose 86 HbA1C 5.4%   Review of Systems  Cardiovascular: Positive for palpitations. Negative for chest pain, dyspnea on exertion, leg swelling and syncope.         Vitals:   01/10/21 0840  BP: 127/75  Pulse: 74  Resp: 16  Temp: 98.7 F (37.1 C)  SpO2: 98%     Body mass index is 28.41 kg/m. Filed Weights   01/10/21 0840  Weight: 176 lb (79.8 kg)    ] Objective:   Physical Exam Vitals and nursing note reviewed.  Constitutional:      General: She is not in acute distress. Neck:     Vascular: No JVD.  Cardiovascular:     Rate and Rhythm: Normal rate and regular rhythm.     Pulses: Normal pulses.     Heart sounds: Normal heart sounds. No murmur heard.   Pulmonary:     Effort: Pulmonary effort is  normal.     Breath sounds: Normal breath sounds. No wheezing or rales.          Assessment & Recommendations:   50 y.o. Caucasian female with palpitations  Palpitations: Likely benign arrhythmias.  Recommend 1 week cardiac monitor.  Atypical chest pain, presyncope:  Suspect vasovagal episodes.  Will obtain echocardiogram and exercise treadmill stress test. Discussed counterpressure maneuvers.  I will see her on as-needed basis, unless any significant abnormalities found on above work-up.  Thank you for referring the patient to [place]. Please feel free to contact with any questions.   Nigel Mormon, MD Pager: [phone] Office: [phone]

## 2021-01-10 ENCOUNTER — Ambulatory Visit: Payer: BC Managed Care – PPO | Admitting: Cardiology

## 2021-01-10 ENCOUNTER — Encounter: Payer: Self-pay | Admitting: Cardiology

## 2021-01-10 ENCOUNTER — Other Ambulatory Visit: Payer: Self-pay

## 2021-01-10 ENCOUNTER — Inpatient Hospital Stay: Payer: BC Managed Care – PPO

## 2021-01-10 VITALS — BP 127/75 | HR 74 | Temp 98.7°F | Resp 16 | Ht 66.0 in | Wt 176.0 lb

## 2021-01-10 DIAGNOSIS — R002 Palpitations: Secondary | ICD-10-CM

## 2021-01-10 DIAGNOSIS — R42 Dizziness and giddiness: Secondary | ICD-10-CM

## 2021-01-10 DIAGNOSIS — R0789 Other chest pain: Secondary | ICD-10-CM | POA: Insufficient documentation

## 2021-01-20 ENCOUNTER — Other Ambulatory Visit (HOSPITAL_COMMUNITY)
Admission: RE | Admit: 2021-01-20 | Discharge: 2021-01-20 | Disposition: A | Discharge status: Home / Self Care | Payer: BC Managed Care – PPO | Source: Ambulatory Visit | Attending: Cardiology | Admitting: Cardiology

## 2021-01-20 DIAGNOSIS — Z01812 Encounter for preprocedural laboratory examination: Secondary | ICD-10-CM | POA: Diagnosis not present

## 2021-01-20 DIAGNOSIS — Z20822 Contact with and (suspected) exposure to covid-19: Secondary | ICD-10-CM | POA: Diagnosis not present

## 2021-01-20 LAB — SARS CORONAVIRUS 2 (TAT 6-24 HRS): SARS Coronavirus 2: NEGATIVE

## 2021-01-22 ENCOUNTER — Other Ambulatory Visit: Payer: Self-pay

## 2021-01-22 ENCOUNTER — Ambulatory Visit: Payer: BC Managed Care – PPO

## 2021-01-22 DIAGNOSIS — R0789 Other chest pain: Secondary | ICD-10-CM

## 2021-01-24 ENCOUNTER — Other Ambulatory Visit: Payer: Self-pay

## 2021-01-24 ENCOUNTER — Ambulatory Visit: Payer: BC Managed Care – PPO

## 2021-01-24 DIAGNOSIS — R0789 Other chest pain: Secondary | ICD-10-CM

## 2021-01-27 ENCOUNTER — Other Ambulatory Visit: Payer: Self-pay | Admitting: Cardiology

## 2021-01-27 DIAGNOSIS — R072 Precordial pain: Secondary | ICD-10-CM

## 2021-01-27 DIAGNOSIS — R0609 Other forms of dyspnea: Secondary | ICD-10-CM

## 2021-01-27 DIAGNOSIS — R9439 Abnormal result of other cardiovascular function study: Secondary | ICD-10-CM

## 2021-01-27 DIAGNOSIS — R0600 Dyspnea, unspecified: Secondary | ICD-10-CM

## 2021-01-27 MED ORDER — METOPROLOL TARTRATE 50 MG PO TABS: 50.0000 mg | ORAL_TABLET | ORAL | 1 refills | Status: DC

## 2021-01-27 NOTE — Progress Notes (Signed)
Discussed the results with the patient. Recommended coronary CTA angiogram.  Staff, please arrange.

## 2021-01-31 ENCOUNTER — Other Ambulatory Visit: Payer: Self-pay | Admitting: Cardiology

## 2021-01-31 DIAGNOSIS — R072 Precordial pain: Secondary | ICD-10-CM

## 2021-01-31 MED ORDER — METOPROLOL TARTRATE 50 MG PO TABS: 50.0000 mg | ORAL_TABLET | ORAL | 1 refills | Status: DC

## 2021-01-31 NOTE — Addendum Note (Signed)
Addended by: Nigel Mormon on: 01/31/2021 01:56 PM   Modules accepted: Orders

## 2021-02-04 LAB — BASIC METABOLIC PANEL
BUN/Creatinine Ratio: 20 (ref 9–23)
BUN: 16 mg/dL (ref 6–24)
CO2: 21 mmol/L (ref 20–29)
Calcium: 9.7 mg/dL (ref 8.7–10.2)
Chloride: 105 mmol/L (ref 96–106)
Creatinine, Ser: 0.8 mg/dL (ref 0.57–1.00)
Glucose: 93 mg/dL (ref 65–99)
Potassium: 5 mmol/L (ref 3.5–5.2)
Sodium: 141 mmol/L (ref 134–144)
eGFR: 89 mL/min/{1.73_m2} (ref >59)

## 2021-02-04 LAB — LIPID PANEL
Chol/HDL Ratio: 4.4 ratio (ref 0.0–4.4)
Cholesterol, Total: 240 mg/dL — ABNORMAL HIGH (ref 100–199)
HDL: 55 mg/dL (ref >39)
LDL Chol Calc (NIH): 172 mg/dL — ABNORMAL HIGH (ref 0–99)
Triglycerides: 75 mg/dL (ref 0–149)
VLDL Cholesterol Cal: 13 mg/dL (ref 5–40)

## 2021-02-05 MED ORDER — METOPROLOL TARTRATE 50 MG PO TABS: 50.0000 mg | ORAL_TABLET | ORAL | 1 refills | Status: DC

## 2021-02-05 NOTE — Addendum Note (Signed)
Addended by: Nigel Mormon on: 02/05/2021 10:53 AM   Modules accepted: Orders

## 2021-02-05 NOTE — Addendum Note (Signed)
Addended by: Nigel Mormon on: 02/05/2021 10:15 AM   Modules accepted: Orders

## 2021-02-08 NOTE — Progress Notes (Signed)
Please make sure she has F/U appointment with MP

## 2021-02-08 NOTE — Progress Notes (Signed)
Markedly elevated LDL, will set up an appointment with MP for follow-up.

## 2021-02-10 ENCOUNTER — Telehealth (HOSPITAL_COMMUNITY): Payer: Self-pay | Admitting: *Deleted

## 2021-02-10 ENCOUNTER — Telehealth: Payer: Self-pay

## 2021-02-10 NOTE — Telephone Encounter (Signed)
Reaching out to patient to offer assistance regarding upcoming cardiac imaging study; pt verbalizes understanding of appt date/time, parking situation and where to check in, pre-test NPO status and medications ordered, and verified current allergies; name and call back number provided for further questions should they arise  Shaley Leavens RN Navigator Cardiac Imaging [facility] [phone] office [phone] cell  

## 2021-02-10 NOTE — Telephone Encounter (Signed)
-----   Message from Brittney Prows, MD sent at 02/08/2021  4:59 PM EST ----- Please make sure she has F/U appointment with MP

## 2021-02-11 NOTE — Progress Notes (Signed)
Called pt to inform her about her labs. Pt understood

## 2021-02-11 NOTE — Telephone Encounter (Signed)
Patient aware and has already scheduled an appointment with Dr. Virgina Jock on 02/27/21 @ 11:15

## 2021-02-12 ENCOUNTER — Other Ambulatory Visit: Payer: Self-pay

## 2021-02-12 ENCOUNTER — Ambulatory Visit (HOSPITAL_COMMUNITY)
Admission: RE | Admit: 2021-02-12 | Discharge: 2021-02-12 | Disposition: A | Discharge status: Home / Self Care | Payer: BC Managed Care – PPO | Source: Ambulatory Visit | Attending: *Deleted | Admitting: *Deleted

## 2021-02-12 ENCOUNTER — Encounter (HOSPITAL_COMMUNITY): Payer: Self-pay

## 2021-02-12 DIAGNOSIS — R0609 Other forms of dyspnea: Secondary | ICD-10-CM

## 2021-02-12 DIAGNOSIS — R9439 Abnormal result of other cardiovascular function study: Secondary | ICD-10-CM | POA: Insufficient documentation

## 2021-02-12 DIAGNOSIS — R0600 Dyspnea, unspecified: Secondary | ICD-10-CM | POA: Diagnosis present

## 2021-02-12 DIAGNOSIS — Z006 Encounter for examination for normal comparison and control in clinical research program: Secondary | ICD-10-CM

## 2021-02-12 MED ORDER — NITROGLYCERIN 0.4 MG SL SUBL
SUBLINGUAL_TABLET | SUBLINGUAL | Status: AC
  Filled 2021-02-12: qty 2

## 2021-02-12 MED ORDER — IOHEXOL 350 MG/ML SOLN
80.0000 mL | Freq: Once | INTRAVENOUS | Status: AC | PRN
  Administered 2021-02-12: 80 mL via INTRAVENOUS

## 2021-02-12 MED ORDER — NITROGLYCERIN 0.4 MG SL SUBL
0.8000 mg | SUBLINGUAL_TABLET | Freq: Once | SUBLINGUAL | Status: AC
  Administered 2021-02-12: 0.8 mg via SUBLINGUAL

## 2021-02-12 NOTE — Research (Signed)
IDENTIFY Informed Consent                  Subject Name: Brittney Riley    Subject met inclusion and exclusion criteria.  The informed consent form, study requirements and expectations were reviewed with the subject and questions and concerns were addressed prior to the signing of the consent form.  The subject verbalized understanding of the trial requirements.  The subject agreed to participate in the IDENTIFY trial and signed the informed consent at 10:20AM on 02/12/21.  The informed consent was obtained prior to performance of any protocol-specific procedures for the subject.  A copy of the signed informed consent was given to the subject and a copy was placed in the subject's medical record.   Meade Maw, Naval architect

## 2021-02-12 NOTE — Progress Notes (Signed)
CT scan completed. Tolerated well. D/C home ambulatory with husband. Awake and alert. In no distress. 

## 2021-02-21 DIAGNOSIS — R0609 Other forms of dyspnea: Secondary | ICD-10-CM | POA: Insufficient documentation

## 2021-02-21 DIAGNOSIS — R9439 Abnormal result of other cardiovascular function study: Secondary | ICD-10-CM | POA: Insufficient documentation

## 2021-02-21 DIAGNOSIS — R0600 Dyspnea, unspecified: Secondary | ICD-10-CM | POA: Insufficient documentation

## 2021-02-26 DIAGNOSIS — E782 Mixed hyperlipidemia: Secondary | ICD-10-CM | POA: Insufficient documentation

## 2021-02-26 DIAGNOSIS — I491 Atrial premature depolarization: Secondary | ICD-10-CM | POA: Insufficient documentation

## 2021-02-26 NOTE — Progress Notes (Signed)
Patient referred by Everardo Beals, NP for palpitations  Subjective:   Brittney Riley, female    DOB: 1971-08-11, 50 y.o.   MRN: [id number]   Chief Complaint  Patient presents with  . Palpitations  . Results    CT, labs    . Follow-up     HPI  50 y.o. Caucasian female with hyperlipidemia, atypical chest pain.  Reviewed recent CTA, stress test, echocardiogram, cardiac monitor, lab results with the patient, details below.  Patient had 1 more episode of retrosternal stabbing pain at work that lasted all day and resolved on its own.  On a separate note, patient reports that she has been noncompliant with her usual weight watchers diet.  She tells me she loves cheeseburger.  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  She has experienced episodes of sudden onset palpitation, lasting for few seconds, associated with chest pressure.  These episodes usually occur at rest.  In addition, she is also experienced episodes of sudden onset lightheadedness, generalized weakness, and near syncope.  These episodes have occurred both at rest and during exertion such as vacuuming her house.  Such episodes last for 10-15 minutes, and are self-limiting.  Patient is a former smoker, quit in 2004.  She does not have any family history of early coronary artery disease.  She drinks 2 cups of coffee every day, does not drink any other caffeine or alcohol.   Current Outpatient Medications on File Prior to Visit  Medication Sig Dispense Refill  . Multiple Vitamins-Minerals (HM MULTIVITAMIN ADULT GUMMY PO) Take by mouth.    . Vitamin D, Ergocalciferol, (DRISDOL) 1.25 MG (50000 UNIT) CAPS capsule Take 50,000 Units by mouth once a week.     Current Facility-Administered Medications on File Prior to Visit  Medication Dose Route Frequency Provider Last Rate Last Admin  . 0.9 %  sodium chloride infusion  500  mL Intravenous Once Ladene Artist, MD        Cardiovascular and other pertinent studies:  CT cardiac scoring 02/12/2021: 1. Coronary calcium score of 0. 2. Normal coronary origin with right dominance. 3. CAD-RADS = 1. Minimal stenosis (0-24%) in the ostial ramus intermedius due to non calcified plaque.  Mobile cardiac telemetry 7 days 01/10/2021 - 01/17/2021: Dominant rhythm: Sinus. HR 46-159 bpm. Avg HR 72 bpm. <1% isolated SVE, couplet/triplets. No atrial fibrillation/atrial flutter/SVT/VT/high grade AV block, sinus pause >3sec noted. 6 patient triggered events, correlated with SVE   Echocardiogram 01/24/2021:  Normal LV systolic function with EF 57%. Left ventricle cavity is normal  in size. Normal global wall motion. Calculated EF 57%.  Mild (Grade I) mitral regurgitation.  Mild tricuspid regurgitation.  No evidence of pulmonary hypertension.  Exercise treadmill stress test 01/22/2021: Exercise treadmill stress test performed using Bruce protocol.  Patient reached 7.8 METS, and 103% of age predicted maximum heart rate.  Exercise capacity was low normal. No chest pain reported.  Normal heart rate and hemodynamic response.  Peak ECG demonstrated sinus tachycardia, 1.5-2 mm horizontal ST depressions in leads II, III, avF, 2-2.5 mm horizontal ST depressions in leads V4-V6, normalize 2 min into recovery. EKG changes are positive for ischemia.  In absence of symptoms, recommend clinical correlation.   EKG 01/10/2021: Sinus rhythm 70 bpm Normal EKG   Recent labs: 02/03/2021: Glucose 93, BUN/Cr 16/0.8. EGFR 89. Na/K 141/5.0.  Chol 240, TG 75, HDL 55, LDL 172 HbA1C  5.4%   Review of Systems  Cardiovascular: Positive for palpitations. Negative for chest pain, dyspnea on exertion, leg swelling and syncope.         Vitals:   02/27/21 1127  BP: 129/81  Pulse: 73  Resp: 16  Temp: 98.2 F (36.8 C)  SpO2: 98%     Body mass index is 28.47 kg/m. Filed Weights   02/27/21  1127  Weight: 176 lb 6.4 oz (80 kg)    ] Objective:   Physical Exam Vitals and nursing note reviewed.  Constitutional:      General: She is not in acute distress. Neck:     Vascular: No JVD.  Cardiovascular:     Rate and Rhythm: Normal rate and regular rhythm.     Pulses: Normal pulses.     Heart sounds: Normal heart sounds. No murmur heard.   Pulmonary:     Effort: Pulmonary effort is normal.     Breath sounds: Normal breath sounds. No wheezing or rales.          Assessment & Recommendations:   50 y.o. Caucasian female with mixed hyperlipidemia, atypical chest pain  Atypical chest pain: No obstructive CAD.  Calcium score 0.  Chest pain likely noncardiac in origin. 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  In the setting of elevated LDL, I recommended statin therapy.  However, patient is reluctant to start medications at this time.  Discussed changes to diet and lifestyle at length.  Especially, I recommended reducing red meat intake and following heart healthy Mediterranean diet, along with regular walking up to 10,000 steps daily. Repeat lipid panel in 3 months.  Echo did not reduce, with cardiac recommend statin therapy.  Symptomatic PVCs: Occasional symptoms.  No medication added at this time.  Atypical chest pain: Sharp stabbing chest pain lasting all day is unrelated to any cardiovascular etiology.  Consider noncardiac cause.  F/u in 3 months   Nigel Mormon, MD Pager: [phone] Office: [phone]

## 2021-02-27 ENCOUNTER — Other Ambulatory Visit: Payer: Self-pay

## 2021-02-27 ENCOUNTER — Ambulatory Visit: Payer: BC Managed Care – PPO | Admitting: Cardiology

## 2021-02-27 ENCOUNTER — Encounter: Payer: Self-pay | Admitting: Cardiology

## 2021-02-27 VITALS — BP 129/81 | HR 73 | Temp 98.2°F | Resp 16 | Ht 66.0 in | Wt 176.4 lb

## 2021-02-27 DIAGNOSIS — E782 Mixed hyperlipidemia: Secondary | ICD-10-CM

## 2021-02-27 DIAGNOSIS — I491 Atrial premature depolarization: Secondary | ICD-10-CM

## 2021-02-27 DIAGNOSIS — R0789 Other chest pain: Secondary | ICD-10-CM

## 2021-05-20 ENCOUNTER — Telehealth: Payer: Self-pay

## 2021-05-20 DIAGNOSIS — Z006 Encounter for examination for normal comparison and control in clinical research program: Secondary | ICD-10-CM

## 2021-05-20 NOTE — Telephone Encounter (Signed)
Called patient for 90 day Identify phone call, patient stated she is still having some of the same shortness of breath at rest that had brought her in, she also stated that she has a follow up with her doctor regarding these symptoms but no further diagnostic testing or procedures were necessary at this time. Lastly I informed her we would be calling her one once more for a year follow up call regarding a follow up in the study in March.

## 2021-05-20 NOTE — Telephone Encounter (Signed)
Called patient for 90 day Identify phone call no answer, I left a voicemail stating the intent of the phone call and our call back number to be reached in our department. 

## 2021-05-29 ENCOUNTER — Ambulatory Visit: Payer: BC Managed Care – PPO | Admitting: Cardiology

## 2021-05-29 ENCOUNTER — Other Ambulatory Visit: Payer: Self-pay

## 2021-05-29 ENCOUNTER — Encounter: Payer: Self-pay | Admitting: Cardiology

## 2021-05-29 VITALS — BP 121/69 | HR 68 | Temp 98.5°F | Resp 16 | Ht 70.0 in | Wt 177.0 lb

## 2021-05-29 DIAGNOSIS — E782 Mixed hyperlipidemia: Secondary | ICD-10-CM

## 2021-05-29 NOTE — Progress Notes (Signed)
Patient referred by Everardo Beals, NP for palpitations  Subjective:   Brittney Riley, female    DOB: [birth year], 50 y.o.   MRN: [id number]   Chief Complaint  Patient presents with   Palpitations   Hyperlipidemia   Follow-up    3 month     HPI  50 y.o. Caucasian female with hyperlipidemia, atypical chest pain.  Patient has made changes to her diet. She was walking regularly, but slowed down due to illness. She has had occasional chest pressure.   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  She has experienced episodes of sudden onset palpitation, lasting for few seconds, associated with chest pressure.  These episodes usually occur at rest.  In addition, she is also experienced episodes of sudden onset lightheadedness, generalized weakness, and near syncope.  These episodes have occurred both at rest and during exertion such as vacuuming her house.  Such episodes last for 10-15 minutes, and are self-limiting.  Patient is a former smoker, quit in 2004.  She does not have any family history of early coronary artery disease.  She drinks 2 cups of coffee every day, does not drink any other caffeine or alcohol.   Current Outpatient Medications on File Prior to Visit  Medication Sig Dispense Refill   Multiple Vitamins-Minerals (HM MULTIVITAMIN ADULT GUMMY PO) Take by mouth.     Current Facility-Administered Medications on File Prior to Visit  Medication Dose Route Frequency Provider Last Rate Last Admin   0.9 %  sodium chloride infusion  500 mL Intravenous Once Ladene Artist, MD        Cardiovascular and other pertinent studies:  CTA 02/12/2021: 1. Coronary calcium score of 0. 2. Normal coronary origin with right dominance. 3. CAD-RADS = 1. Minimal stenosis (0-24%) in the ostial ramus intermedius due to non calcified plaque.  Mobile cardiac telemetry 7 days 01/10/2021 -  01/17/2021: Dominant rhythm: Sinus. HR 46-159 bpm. Avg HR 72 bpm. <1% isolated SVE, couplet/triplets. No atrial fibrillation/atrial flutter/SVT/VT/high grade AV block, sinus pause >3sec noted. 6 patient triggered events, correlated with SVE    Echocardiogram 01/24/2021:  Normal LV systolic function with EF 57%. Left ventricle cavity is normal  in size. Normal global wall motion. Calculated EF 57%.  Mild (Grade I) mitral regurgitation.  Mild tricuspid regurgitation.  No evidence of pulmonary hypertension.  Exercise treadmill stress test 01/22/2021: Exercise treadmill stress test performed using Bruce protocol.  Patient reached 7.8 METS, and 103% of age predicted maximum heart rate.  Exercise capacity was low normal. No chest pain reported.  Normal heart rate and hemodynamic response.  Peak ECG demonstrated sinus tachycardia, 1.5-2 mm horizontal ST depressions in leads II, III, avF, 2-2.5 mm horizontal ST depressions in leads V4-V6, normalize 2 min into recovery. EKG changes are positive for ischemia.  In absence of symptoms, recommend clinical correlation.   EKG 01/10/2021: Sinus rhythm 70 bpm Normal EKG   Recent labs: 02/03/2021: Glucose 93, BUN/Cr 16/0.8. EGFR 89. Na/K 141/5.0.  Chol 240, TG 75, HDL 55, LDL 172 HbA1C 5.4%   Review of Systems  Cardiovascular:  Positive for chest pain. Negative for dyspnea on exertion, leg swelling, palpitations and syncope.        Vitals:   05/29/21 1133  BP: 121/69  Pulse: 68  Resp: 16  Temp: 98.5 F (36.9 C)  SpO2: 98%    Body mass index is 25.4 kg/m.  Filed Weights   05/29/21 1133  Weight: 177 lb (80.3 kg)     Objective:   Physical Exam Vitals and nursing note reviewed.  Constitutional:      General: She is not in acute distress. Neck:     Vascular: No JVD.  Cardiovascular:     Rate and Rhythm: Normal rate and regular rhythm.     Heart sounds: Normal heart sounds. No murmur heard. Pulmonary:     Effort: Pulmonary effort  is normal.     Breath sounds: Normal breath sounds. No wheezing or rales.  Musculoskeletal:     Right lower leg: No edema.     Left lower leg: No edema.         Assessment & Recommendations:   50 y.o. Caucasian female with mixed hyperlipidemia, atypical chest pain  Atypical chest pain: No obstructive CAD.  Calcium score 0.  Chest pain likely noncardiac in origin. 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  In the setting of elevated LDL, I recommended statin therapy.  However, patient is reluctant to start medications at this time.  Discussed changes to diet and lifestyle at length.  Especially, I recommended reducing red meat intake and following heart healthy Mediterranean diet, along with regular walking up to 10,000 steps daily. Check lipid panel today.  Symptomatic PVCs: Occasional symptoms.  No medication added at this time.  Atypical chest pain: Likely noncardiac in etiology  F/u as needed    Nigel Mormon, MD Pager: [phone] Office: [phone]

## 2021-06-04 LAB — LIPID PANEL
Chol/HDL Ratio: 4.1 ratio (ref 0.0–4.4)
Cholesterol, Total: 245 mg/dL — ABNORMAL HIGH (ref 100–199)
HDL: 60 mg/dL (ref >39)
LDL Chol Calc (NIH): 173 mg/dL — ABNORMAL HIGH (ref 0–99)
Triglycerides: 72 mg/dL (ref 0–149)
VLDL Cholesterol Cal: 12 mg/dL (ref 5–40)

## 2021-06-25 ENCOUNTER — Other Ambulatory Visit: Payer: Self-pay | Admitting: Obstetrics and Gynecology

## 2021-06-25 DIAGNOSIS — R109 Unspecified abdominal pain: Secondary | ICD-10-CM

## 2021-07-12 ENCOUNTER — Other Ambulatory Visit: Payer: Self-pay

## 2021-07-12 ENCOUNTER — Ambulatory Visit
Admission: RE | Admit: 2021-07-12 | Discharge: 2021-07-12 | Disposition: A | Discharge status: Home / Self Care | Payer: BC Managed Care – PPO | Source: Ambulatory Visit | Attending: Obstetrics and Gynecology | Admitting: Obstetrics and Gynecology

## 2021-07-12 DIAGNOSIS — R109 Unspecified abdominal pain: Secondary | ICD-10-CM

## 2021-07-12 MED ORDER — GADOBENATE DIMEGLUMINE 529 MG/ML IV SOLN
16.0000 mL | Freq: Once | INTRAVENOUS | Status: AC | PRN
  Administered 2021-07-12: 16 mL via INTRAVENOUS

## 2022-02-20 ENCOUNTER — Other Ambulatory Visit: Payer: Self-pay | Admitting: Physician Assistant

## 2022-02-20 DIAGNOSIS — Z1231 Encounter for screening mammogram for malignant neoplasm of breast: Secondary | ICD-10-CM

## 2022-02-26 ENCOUNTER — Ambulatory Visit: Payer: BC Managed Care – PPO | Admitting: Physician Assistant

## 2022-02-26 ENCOUNTER — Encounter: Payer: Self-pay | Admitting: Physician Assistant

## 2022-02-26 ENCOUNTER — Other Ambulatory Visit (INDEPENDENT_AMBULATORY_CARE_PROVIDER_SITE_OTHER): Payer: BC Managed Care – PPO

## 2022-02-26 VITALS — BP 129/82 | HR 67 | Resp 18 | Ht 66.0 in | Wt 173.0 lb

## 2022-02-26 DIAGNOSIS — R9082 White matter disease, unspecified: Secondary | ICD-10-CM

## 2022-02-26 DIAGNOSIS — R413 Other amnesia: Secondary | ICD-10-CM

## 2022-02-26 LAB — VITAMIN B12: Vitamin B-12: 262 pg/mL (ref 211–911)

## 2022-02-26 NOTE — Patient Instructions (Addendum)
It was a pleasure to see you today at our office.  ? ?Recommendations: ? ?MRI of the brain, the radiology office will call you to arrange you appointment ?Follow up 1 month ? ?RECOMMENDATIONS FOR ALL PATIENTS WITH MEMORY PROBLEMS: ?1. Continue to exercise (Recommend 30 minutes of walking everyday, or 3 hours every week) ?2. Increase social interactions - continue going to [place] and enjoy social gatherings with friends and family ?3. Eat healthy, avoid fried foods and eat more fruits and vegetables ?4. Maintain adequate blood pressure, blood sugar, and blood cholesterol level. Reducing the risk of stroke and cardiovascular disease also helps promoting better memory. ?5. Avoid stressful situations. Live a simple life and avoid aggravations. Organize your time and prepare for the next day in anticipation. ?6. Sleep well, avoid any interruptions of sleep and avoid any distractions in the bedroom that may interfere with adequate sleep quality ?7. Avoid sugar, avoid sweets as there is a strong link between excessive sugar intake, diabetes, and cognitive impairment ?We discussed the Mediterranean diet, which has been shown to help patients reduce the risk of progressive memory disorders and reduces cardiovascular risk. This includes eating fish, eat fruits and green leafy vegetables, nuts like almonds and hazelnuts, walnuts, and also use olive oil. Avoid fast foods and fried foods as much as possible. Avoid sweets and sugar as sugar use has been linked to worsening of memory function. ? ?There is always a concern of gradual progression of memory problems. If this is the case, then we may need to adjust level of care according to patient needs. Support, both to the patient and caregiver, should then be put into place.  ? ? ? ? ?You have been referred for a neuropsychological evaluation (i.e., evaluation of memory and thinking abilities). Please bring someone with you to this appointment if possible, as it is helpful for  the doctor to hear from both you and another adult who knows you well. Please bring eyeglasses and hearing aids if you wear them.  ?  ?The evaluation will take approximately 3 hours and has two parts: ?  ?The first part is a clinical interview with the neuropsychologist (Dr. Melvyn Novas or Dr. Nicole Kindred). During the interview, the neuropsychologist will speak with you and the individual you brought to the appointment.  ?  ?The second part of the evaluation is testing with the doctor's technician Hinton Dyer or Maudie Mercury). During the testing, the technician will ask you to remember different types of material, solve problems, and answer some questionnaires. Your family member will not be present for this portion of the evaluation. ?  ?Please note: We must reserve several hours of the neuropsychologist's time and the psychometrician's time for your evaluation appointment. As such, there is a No-Show fee of $100. If you are unable to attend any of your appointments, please contact our office as soon as possible to reschedule.  ? ? ?FALL PRECAUTIONS: Be cautious when walking. Scan the area for obstacles that may increase the risk of trips and falls. When getting up in the mornings, sit up at the edge of the bed for a few minutes before getting out of bed. Consider elevating the bed at the head end to avoid drop of blood pressure when getting up. Walk always in a well-lit room (use night lights in the walls). Avoid area rugs or power cords from appliances in the middle of the walkways. Use a walker or a cane if necessary and consider physical therapy for balance exercise. Get your eyesight  checked regularly. ? ?FINANCIAL OVERSIGHT: Supervision, especially oversight when making financial decisions or transactions is also recommended. ? ?HOME SAFETY: Consider the safety of the kitchen when operating appliances like stoves, microwave oven, and blender. Consider having supervision and share cooking responsibilities until no longer able to  participate in those. Accidents with firearms and other hazards in the house should be identified and addressed as well. ? ? ?ABILITY TO BE LEFT ALONE: If patient is unable to contact 911 operator, consider using [employer], or when the need is there, arrange for someone to stay with patients. Smoking is a fire hazard, consider supervision or cessation. Risk of wandering should be assessed by caregiver and if detected at any point, supervision and safe proof recommendations should be instituted. ? ?MEDICATION SUPERVISION: Inability to self-administer medication needs to be constantly addressed. Implement a mechanism to ensure safe administration of the medications. ? ? ?DRIVING: Regarding driving, in patients with progressive memory problems, driving will be impaired. We advise to have someone else do the driving if trouble finding directions or if minor accidents are reported. Independent driving assessment is available to determine safety of driving. ? ? ?If you are interested in the driving assessment, you can contact the following: ? ?[facility] [employer] in [place] ? ?[place] [phone] ? ?[facility] [phone] ? ?[facility] [phone] or [phone] ? ? ? ?Mediterranean Diet ?A Mediterranean diet refers to food and lifestyle choices that are based on the traditions of countries located on the [employer]. This way of eating has been shown to help prevent certain conditions and improve outcomes for people who have chronic diseases, like kidney disease and heart disease. ?What are tips for following this plan? ?Lifestyle  ?Cook and eat meals together with your family, when possible. ?Drink enough fluid to keep your urine clear or pale yellow. ?Be physically active every day. This includes: ?Aerobic exercise like running or swimming. ?Leisure activities like gardening, walking, or housework. ?Get 7-8 hours of sleep each night. ?If recommended  by your health care provider, drink red wine in moderation. This means 1 glass a day for nonpregnant women and 2 glasses a day for men. A glass of wine equals 5 oz (150 mL). ?Reading food labels  ?Check the serving size of packaged foods. For foods such as rice and pasta, the serving size refers to the amount of cooked product, not dry. ?Check the total fat in packaged foods. Avoid foods that have saturated fat or trans fats. ?Check the ingredients list for added sugars, such as corn syrup. ?Shopping  ?At the grocery store, buy most of your food from the areas near the walls of the store. This includes: ?Fresh fruits and vegetables (produce). ?Grains, beans, nuts, and seeds. Some of these may be available in unpackaged forms or large amounts (in bulk). ?Fresh seafood. ?Poultry and eggs. ?Low-fat dairy products. ?Buy whole ingredients instead of prepackaged foods. ?Buy fresh fruits and vegetables in-season from local farmers markets. ?Buy frozen fruits and vegetables in resealable bags. ?If you do not have access to quality fresh seafood, buy precooked frozen shrimp or canned fish, such as tuna, salmon, or sardines. ?Buy small amounts of raw or cooked vegetables, salads, or olives from the deli or salad bar at your store. ?Stock your pantry so you always have certain foods on hand, such as olive oil, canned tuna, canned tomatoes, rice, pasta, and beans. ?Cooking  ?Cook foods with extra-virgin olive oil instead of using butter or other vegetable  oils. ?Have meat as a side dish, and have vegetables or grains as your main dish. This means having meat in small portions or adding small amounts of meat to foods like pasta or stew. ?Use beans or vegetables instead of meat in common dishes like chili or lasagna. ?Experiment with different cooking methods. Try roasting or broiling vegetables instead of steaming or saut?eing them. ?Add frozen vegetables to soups, stews, pasta, or rice. ?Add nuts or seeds for added healthy fat  at each meal. You can add these to yogurt, salads, or vegetable dishes. ?Marinate fish or vegetables using olive oil, lemon juice, garlic, and fresh herbs. ?Meal planning  ?Plan to eat 1 vegetarian meal one d

## 2022-02-26 NOTE — Progress Notes (Addendum)
? ? ?Assessment/Plan:  ? ?NGINA ROYER is a very pleasant 51 y.o. year old RH female with  a history of hypertension, hyperlipidemia, anxiety, depression seen today for evaluation of memory difficulties. Prior MRI  showed several bilateral periventricular T2 hyperintense lesions suggestive of a demyelinating process., but patient declined LP at the time.  MRI C and T spine was negative.  MoCA today is 28/30, delayed recall 4/5. She does complain of tingling and itchiness on the R leg, mild numbness but denies unilateral weakness.  She reports R hand paresthesia. She reports "fogginess" and "a weird sensation in the head", occasionally neck spasms, clumsiness and worsening vision-no double vision-  and continues to experience some L positional vertigo.  ? ? ? Recommendations:  ? ?Memory Loss  ?Abnormal MRI suspicious for demyelinating process ? ?MRI brain with/without contrast to assess for underlying structural abnormality and assess vascular load  ? Check B12 ?Control cardiovascular risk factors  ?Mediterranean diet is recommended  ?Folllow up in 4 weeks. If MS suspected in imaging, will proceed with diagnostic testing, including LP.  ? ? ?Subjective:  ? ? ?The patient is seen in neurologic consultation at the request of Harless Litten, NP for the evaluation of memory.  The patient is accompanied by her sister who supplements the history. ?This is a 51 y.o. year old RH  female who has had memory issues for about 2 or 3 years, when she began to forget names of people, repeating himself, and reporting "foggy brain ".  She feels clumsier than before, but denies any falls or head injuries.  She denies being disoriented when walking into her room, or leaving objects in unusual places.  She ambulates most of the time without difficulty, although occasionally she has some clumsiness on both legs.  She also developed tingling, on the right lower extremity, which has ascended to above the knee, and skin itching  without a rash.  She continues to drive, occasionally needs she becomes disoriented to where to go.  She has a history of depression, but this has been stable.  She reports being "crankier than before, especially since menopause ".  Occasionally she has hot flashes.  She enjoys doing crossword puzzles, word finding, and likes to read.  She does not sleep well, tosses and turns for the few years, tried melatonin without relief.  She denies vivid dreams or sleepwalking, hallucinations or paranoia.  There are no hygiene concerns, she is independent of bathing and dressing.  Her husband puts the medications on it, and she is compliant with them.  She is in charge of her finances.  She has good appetite, denies trouble swallowing, but at times she feels that "I am wearing a turtleneck that is tight, I feel when I am swallowing ".  She denies any regurgitation.  She does not cook.  She denies any headaches, double vision but she does report less acuity than before, has a harder time focusing which hinders her reading.  She denies any dizziness, tremors or anosmia.  No history of seizures.  In the recent past, she had several issues with urgency, but lately she denies any urine incontinence or any recent infections.  She denies any constipation or diarrhea.    She has a history of positional vertigo, for which she was seen by vestibular therapy, "but it did not help ".  Of note, she was told about 10 years ago, when experiencing left-sided numbness and weakness, that there was a concern for MS based  on brain MRI, but she refused to do a spinal tap.  She had another episode of left leg heaviness in July 2018, lasting for about 10 seconds, which was unusual for MS flare.  She was seen at our office, MRI of the brain with and without contrast was ordered, which was still concerning for MS.  EEG was negative.  MRI of the cervical and thoracic spine were negative.  LP was recommended, but patient again declined. 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 She denies a history of sleep apnea, alcohol or tobacco.  Family history negative for dementia or MS ? ?08/27/17 MRI brain w and wo contrast  Several bilateral periventricular T2 hyperintense lesions. The pattern is suggestive of a demyelinating process. There are no acute features.   ? ? ?Allergies  ?Allergen Reactions  ? Doxycycline Other (See Comments)  ?  Throat closed up  ? Levofloxacin Itching and Rash  ?  Itching all over - per patient  ? Meclizine Other (See Comments)  ?  Patient reports made her groggy  ? ? ?Current Outpatient Medications  ?Medication Instructions  ? estradiol (ESTRACE) 0.1 MG/GM vaginal cream estradiol 0.01% (0.1 mg/gram) vaginal cream ? INSERT 1 G TWICE A WEEK BY VAGINAL ROUTE AT BEDTIME.  ? Multiple Vitamins-Minerals (HM MULTIVITAMIN ADULT GUMMY PO) Oral  ? rosuvastatin (CRESTOR) 20 mg, Oral, Daily  ? ? ? ?VITALS:   ?Vitals:  ? 02/26/22 0950  ?BP: 129/82  ?Pulse: 67  ?Resp: 18  ?SpO2: 95%  ?Weight: 173 lb (78.5 kg)  ?Height: '5\' 6"'$  (1.676 m)  ? ? ?  01/26/2019  ?  8:28 AM 03/21/2018  ? 10:42 AM 12/15/2017  ?  2:05 PM 10/20/2017  ? 11:29 AM 07/09/2017  ?  2:13 PM  ?Depression screen PHQ 2/9  ?Decreased Interest 0 0 1 0 0  ?Down, Depressed, Hopeless 0 0 0 0 0  ?PHQ - 2 Score 0 0 1 0 0  ? ? ?PHYSICAL EXAM  ? ?HEENT:  Normocephalic, atraumatic. The mucous membranes are moist. The superficial temporal arteries are without ropiness or tenderness. ?Cardiovascular: Regular rate and rhythm. ?Lungs: Clear to auscultation bilaterally. ?Neck: There are no carotid bruits noted bilaterally. ? ?NEUROLOGICAL: ? ?  02/26/2022  ? 12:00 PM  ?Montreal Cognitive Assessment   ?Visuospatial/ Executive (0/5) 4  ?Naming (0/3) 3  ?Attention: Read list of digits (0/2) 2  ?Attention: Read list of letters (0/1) 1  ?Attention: Serial 7 subtraction starting at 100 (0/3) 3  ?Language: Repeat phrase  (0/2) 2  ?Language : Fluency (0/1) 1  ?Abstraction (0/2) 2  ?Delayed Recall (0/5) 4  ?Orientation (0/6) 6  ?Total 28  ?Adjusted Score (based on education) 28  ?  ? ?  08/04/2017  ?  9:00 AM  ?MMSE - Mini Mental State Exam  ?Orientation to time 5  ?Orientation to Place 5  ?Registration 3  ?Attention/ Calculation 5  ?Recall 2  ?Language- name 2 objects 2  ?Language- repeat 1  ?Language- follow 3 step command 3  ?Language- read & follow direction 1  ?Write a sentence 1  ?Copy design 1  ?Total score 29  ?  ? ?Orientation:  Alert and oriented to person, place and time. No aphasia or dysarthria. Fund of knowledge is appropriate. Recent memory impaired and remote memory intact.  Attention and concentration are normal.  Able to name objects and  repeat phrases. Delayed recall  3/5 ?Cranial nerves: There is good facial symmetry. Extraocular muscles are intact and visual fields are full to confrontational testing. Speech is fluent and clear. Soft palate rises symmetrically and there is no tongue deviation. Hearing is intact to conversational tone. ?Tone: Tone is good throughout. ?Sensation: Sensation is intact to light touch and pinprick throughout. Vibration is intact at the bilateral big toe.There is no extinction with double simultaneous stimulation. There is no sensory dermatomal level identified. ?Coordination: The patient has no difficulty with RAM's or FNF bilaterally. Normal finger to nose  ?Motor: Strength is 5/5 in the bilateral upper and lower extremities. There is no pronator drift. There are no fasciculations noted. ?DTR's: Deep tendon reflexes are 2/4 at the bilateral biceps, triceps, brachioradialis, patella and achilles.  Plantar responses are downgoing bilaterally. ?Gait and Station: The patient is able to ambulate without difficulty.The patient is able to heel toe walk without any difficulty.The patient is able to ambulate in a tandem fashion. The patient is able to stand in the Romberg position. ?  ? ? ?Thank  you for allowing [place] the opportunity to participate in the care of this nice patient. Please do not hesitate to contact us for any questions or concerns.  ? ?Total time spent on today's visit was 60 minutes,

## 2022-02-26 NOTE — Progress Notes (Signed)
PLease inform patient that B12 is low , take 1 tab 1000 microgram daily , follow up with primary doctor

## 2022-02-27 NOTE — Progress Notes (Signed)
No answer at 9:27am 02/27/2022  ?

## 2022-03-19 ENCOUNTER — Ambulatory Visit: Payer: BC Managed Care – PPO | Admitting: Neurology

## 2022-03-23 ENCOUNTER — Ambulatory Visit: Payer: BC Managed Care – PPO | Admitting: Physician Assistant

## 2022-03-31 ENCOUNTER — Telehealth: Payer: Self-pay | Admitting: Physician Assistant

## 2022-03-31 NOTE — Telephone Encounter (Signed)
Patient called and stated she has an MRI scheduled for Friday.  She stated that she is claustrophobic.  She wanted to know if she could get a medication to help with this. ?

## 2022-04-01 ENCOUNTER — Other Ambulatory Visit: Payer: Self-pay

## 2022-04-01 ENCOUNTER — Ambulatory Visit
Admission: RE | Admit: 2022-04-01 | Discharge: 2022-04-01 | Disposition: A | Discharge status: Home / Self Care | Payer: BC Managed Care – PPO | Source: Ambulatory Visit | Attending: Physician Assistant | Admitting: Physician Assistant

## 2022-04-01 DIAGNOSIS — Z1231 Encounter for screening mammogram for malignant neoplasm of breast: Secondary | ICD-10-CM

## 2022-04-01 MED ORDER — DIAZEPAM 5 MG PO TABS: ORAL_TABLET | ORAL | 0 refills | Status: DC

## 2022-04-01 NOTE — Telephone Encounter (Signed)
Needs to be printed and then she will be able to pick it up. Thanks  ?It will be sent to [facility] [address] ?

## 2022-04-01 NOTE — Telephone Encounter (Signed)
Printed off  Rx for Brittney Riley to [facility].  ?

## 2022-04-03 ENCOUNTER — Ambulatory Visit
Admission: RE | Admit: 2022-04-03 | Discharge: 2022-04-03 | Disposition: A | Discharge status: Home / Self Care | Payer: BC Managed Care – PPO | Source: Ambulatory Visit | Attending: Physician Assistant | Admitting: Physician Assistant

## 2022-04-03 MED ORDER — GADOBENATE DIMEGLUMINE 529 MG/ML IV SOLN
15.0000 mL | Freq: Once | INTRAVENOUS | Status: AC | PRN
  Administered 2022-04-03: 15 mL via INTRAVENOUS

## 2022-04-07 ENCOUNTER — Telehealth: Payer: Self-pay | Admitting: Physician Assistant

## 2022-04-07 NOTE — Telephone Encounter (Signed)
Patient called for MRI results she has seen on the portal.  ? ?She is aware they may not be ready for her yet. ?

## 2022-04-08 ENCOUNTER — Telehealth: Payer: Self-pay | Admitting: Physician Assistant

## 2022-04-08 NOTE — Telephone Encounter (Signed)
Patient advised of MRI results, voiced understanding and will discuss on follow up  ?

## 2022-04-08 NOTE — Telephone Encounter (Signed)
Patient called again for MRI results.  ? ?She called yesterday as well but the encounter is closed. ?

## 2022-04-16 ENCOUNTER — Ambulatory Visit: Payer: BC Managed Care – PPO | Admitting: Physician Assistant

## 2022-04-16 ENCOUNTER — Encounter: Payer: Self-pay | Admitting: Physician Assistant

## 2022-04-16 VITALS — BP 127/85 | HR 84 | Resp 18 | Ht 66.0 in | Wt 178.0 lb

## 2022-04-16 DIAGNOSIS — R413 Other amnesia: Secondary | ICD-10-CM

## 2022-04-16 DIAGNOSIS — R9082 White matter disease, unspecified: Secondary | ICD-10-CM | POA: Diagnosis not present

## 2022-04-16 NOTE — Progress Notes (Signed)
Had sx in    Assessment/Plan:   Mild Cognitive Impairment   51 y.o. year old RH female with  a history of hypertension, hyperlipidemia, anxiety, depression seen today in follow up for evaluation of memory difficulties. Last MoCA on 02/26/22 was 28/30 with delayed recall 4/5. Since her visit her memory is stable, without any worsening symptoms. MRI brain on 04/03/22 reviewed by me showed no acute abnormalities but non specific white matter abnormalities bilaterally, similar to those found in 2010. She complains of  R leg mild paresthesia and itching, R hand paresthesia, fogginess and neck spasms, clumsiness and chronic intermittent L positional vertigo, of unclear etiology. She denies a history of MS.      Recommendations:    MRI C and T spine with and without contrast, to further evaluate for structural abnormalities/lesions. Pending on the results, she may need LP for diagnosis . Follow up in 1 month   Case discussed with Dr. Delice Lesch who agrees with the plan    Subjective:    Brittney Riley is a very pleasant 51 y.o. RH female  seen today in follow up for memory loss. This patient is here alone  Previous records as well as any outside records available were reviewed prior to todays visit.  Patient was last seen at our office on 02/26/2022 at which time her MoCA was 28/30. She is not on antidementia medications    Any changes in memory since last visit? Patient denies, symptoms not worsened. She continues to read different subjects, including the Bible. Patient lives with: Spouse   repeats oneself?  Patient denies Disoriented when walking into a room?  Occasionally Leaving objects in unusual places?  Patient denies   Ambulates  with difficulty?   Patient  occasionally feels clumsy on her  R foot.  Recent falls?  Patient denies   Any head injuries?  Patient denies   History of seizures?   Patient denies   Wandering behavior?  Patient denies   Patient drives?   Endorsed, no  difficulties Any mood changes such irritability agitation?  Patient denies   Any history of depression?: Endorsed Hallucinations?  Patient denies   Paranoia?  Patient denies   Patient reports that he sleeps well without vivid dreams, REM behavior or sleepwalking     Any hygiene concerns?  Patient denies   Independent of bathing and dressing?  Endorsed  Does the patient needs help with medications?  Patient denies Who is in charge of the finances?  Patient is in charge     Any changes in appetite?  Patient denies   Patient have trouble swallowing? Patient denies   Does the patient cook?  Patient denies   Any kitchen accidents such as leaving the stove on? Patient denies   Any headaches?  Patient denies   The double vision? Patient denies   Any focal numbness or tingling? On the R thigh she experiences some paresthesias and itching sensation without a rash. HSe also experiences spasms in her neck on a regular basis  Chronic back pain Patient denies   Unilateral weakness?  Patient denies   Any tremors?  Patient denies   Any history of anosmia?  Patient denies   Any incontinence of urine? She has some urgency at times, and some stress incontinence.  Any bowel dysfunction?   Patient denies        Initial Visit 02/26/22 patient is seen in neurologic consultation at the request of Harless Litten, NP for the evaluation of memory.  The patient is accompanied by her sister who supplements the history. This is a 51 y.o. year old RH  female who has had memory issues for about 2 or 3 years, when she began to forget names of people, repeating himself, and reporting "foggy brain ".  She feels clumsier than before, but denies any falls or head injuries.  She denies being disoriented when walking into her room, or leaving objects in unusual places.  She ambulates most of the time without difficulty, although occasionally she has some clumsiness on both legs.  She also developed tingling, on the right lower  extremity, which has ascended to above the knee, and skin itching without a rash.  She continues to drive, occasionally needs she becomes disoriented to where to go.  She has a history of depression, but this has been stable.  She reports being "crankier than before, especially since menopause ".  Occasionally she has hot flashes.  She enjoys doing crossword puzzles, word finding, and likes to read.  She does not sleep well, tosses and turns for the few years, tried melatonin without relief.  She denies vivid dreams or sleepwalking, hallucinations or paranoia.  There are no hygiene concerns, she is independent of bathing and dressing.  Her husband puts the medications on it, and she is compliant with them.  She is in charge of her finances.  She has good appetite, denies trouble swallowing, but at times she feels that "I am wearing a turtleneck that is tight, I feel when I am swallowing ".  She denies any regurgitation.  She does not cook.  She denies any headaches, double vision but she does report less acuity than before, has a harder time focusing which hinders her reading.  She denies any dizziness, tremors or anosmia.  No history of seizures.  In the recent past, she had several issues with urgency, but lately she denies any urine incontinence or any recent infections.  She denies any constipation or diarrhea.    She has a history of positional vertigo, for which she was seen by vestibular therapy, "but it did not help ".  Of note, she was told about 10 years ago, when experiencing left-sided numbness and weakness, that there was a concern for MS based on brain MRI, but she refused to do a spinal tap.  She had another episode of left leg heaviness in July 2018, lasting for about 10 seconds, which was unusual for MS flare.  She was seen at our office, MRI of the brain with and without contrast was ordered, which was still concerning for MS.  EEG was negative.  MRI of the cervical and thoracic spine were  negative.  LP was recommended, but patient again declined. 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 She denies a history of sleep apnea, alcohol or tobacco.  Family history negative for dementia or MS   08/27/17 MRI brain w and wo contrast  Several bilateral periventricular T2 hyperintense lesions. The pattern is suggestive of a demyelinating process. There are no acute features.      MRI brain 04/03/22 . No acute intracranial abnormality.2. Moderately advanced signal abnormality in the bilateral cerebral white matter is in a nonspecific configuration. Similar findings parietal white matter abnormality reported in 2010. Differential considerations include accelerated/hereditary small vessel ischemia, sequelae of hypercoagulable state, vasculitis, migraines, prior infection, or demyelination.  Past Medical History:  Diagnosis Date  Colon polyps 04/25/2015   Tubular adenomas   GERD (gastroesophageal reflux disease)    Vertigo    Vitamin B deficiency    Vitamin D deficiency      Past Surgical History:  Procedure Laterality Date   ABDOMINAL HYSTERECTOMY  2012   APPENDECTOMY  1986   BREAST BIOPSY Left 2016   [place], 2002   COLONOSCOPY  2016   HERNIA REPAIR  2440   umbilical   OVARIAN CYST REMOVAL  1993     PREVIOUS MEDICATIONS:   CURRENT MEDICATIONS:  Outpatient Encounter Medications as of 04/16/2022  Medication Sig   diazepam (VALIUM) 5 MG tablet Take 1 tab 30 mins prior to the procedure, may add a second dose if the first tab not therapeutic   estradiol (ESTRACE) 0.1 MG/GM vaginal cream estradiol 0.01% (0.1 mg/gram) vaginal cream  INSERT 1 G TWICE A WEEK BY VAGINAL ROUTE AT BEDTIME.   Multiple Vitamins-Minerals (HM MULTIVITAMIN ADULT GUMMY PO) Take by mouth.   rosuvastatin (CRESTOR) 20 MG tablet Take 20 mg by mouth daily.   Facility-Administered Encounter Medications as  of 04/16/2022  Medication   0.9 %  sodium chloride infusion     Objective:     PHYSICAL EXAMINATION:    VITALS:   Vitals:   04/16/22 0906  BP: 127/85  Pulse: 84  Resp: 18  SpO2: 99%  Weight: 178 lb (80.7 kg)  Height: '5\' 6"'$  (1.676 m)    GEN:  The patient appears stated age and is in NAD. HEENT:  Normocephalic, atraumatic.   Neurological examination:  General: NAD, well-groomed, appears stated age. Orientation: The patient is alert. Oriented to person, place and date Cranial nerves: There is good facial symmetry.The speech is fluent and clear. No aphasia or dysarthria. Fund of knowledge is appropriate. Recent memory impaired and remote memory is normal.  Attention and concentration are normal.  Able to name objects and repeat phrases.  Hearing is intact to conversational tone.    Sensation: Sensation is intact to light touch throughout Motor: Strength is at least antigravity x4. Tremors: none  DTR's 2/4 in UE/LE      02/26/2022   12:00 PM  Montreal Cognitive Assessment   Visuospatial/ Executive (0/5) 4  Naming (0/3) 3  Attention: Read list of digits (0/2) 2  Attention: Read list of letters (0/1) 1  Attention: Serial 7 subtraction starting at 100 (0/3) 3  Language: Repeat phrase (0/2) 2  Language : Fluency (0/1) 1  Abstraction (0/2) 2  Delayed Recall (0/5) 4  Orientation (0/6) 6  Total 28  Adjusted Score (based on education) 28       08/04/2017    9:00 AM  MMSE - Mini Mental State Exam  Orientation to time 5  Orientation to Place 5  Registration 3  Attention/ Calculation 5  Recall 2  Language- name 2 objects 2  Language- repeat 1  Language- follow 3 step command 3  Language- read & follow direction 1  Write a sentence 1  Copy design 1  Total score 29       Movement examination: Tone: There is normal tone in the UE/LE Abnormal movements:  no tremor.  No myoclonus.  No asterixis.   Coordination:  There is no decremation with RAM's. Normal finger to  nose  Gait and Station: The patient has no difficulty arising out of a deep-seated chair without the use of the hands. The patient's stride length is good.  Gait is cautious and narrow.  Thank you for allowing [place] the opportunity to participate in the care of this nice patient. Please do not hesitate to contact us for any questions or concerns.   Total time spent on today's visit was 27 minutes dedicated to this patient today, preparing to see patient, examining the patient, ordering tests and/or medications and counseling the patient, documenting clinical information in the EHR or other health record, independently interpreting results and communicating results to the patient/family, discussing treatment and goals, answering patient's questions and coordinating care.  Cc:  Everardo Beals, NP  Sharene Butters 04/18/2022 6:45 PM

## 2022-04-16 NOTE — Patient Instructions (Signed)
It was a pleasure to see you today at our office.   Recommendations:  MRI C and T spine, the radiology office will call you to arrange you appointment Follow up 1 month  RECOMMENDATIONS FOR ALL PATIENTS WITH MEMORY PROBLEMS: 1. Continue to exercise (Recommend 30 minutes of walking everyday, or 3 hours every week) 2. Increase social interactions - continue going to [place] and enjoy social gatherings with friends and family 3. Eat healthy, avoid fried foods and eat more fruits and vegetables 4. Maintain adequate blood pressure, blood sugar, and blood cholesterol level. Reducing the risk of stroke and cardiovascular disease also helps promoting better memory. 5. Avoid stressful situations. Live a simple life and avoid aggravations. Organize your time and prepare for the next day in anticipation. 6. Sleep well, avoid any interruptions of sleep and avoid any distractions in the bedroom that may interfere with adequate sleep quality 7. Avoid sugar, avoid sweets as there is a strong link between excessive sugar intake, diabetes, and cognitive impairment We discussed the Mediterranean diet, which has been shown to help patients reduce the risk of progressive memory disorders and reduces cardiovascular risk. This includes eating fish, eat fruits and green leafy vegetables, nuts like almonds and hazelnuts, walnuts, and also use olive oil. Avoid fast foods and fried foods as much as possible. Avoid sweets and sugar as sugar use has been linked to worsening of memory function.  There is always a concern of gradual progression of memory problems. If this is the case, then we may need to adjust level of care according to patient needs. Support, both to the patient and caregiver, should then be put into place.      You have been referred for a neuropsychological evaluation (i.e., evaluation of memory and thinking abilities). Please bring someone with you to this appointment if possible, as it is helpful for  the doctor to hear from both you and another adult who knows you well. Please bring eyeglasses and hearing aids if you wear them.    The evaluation will take approximately 3 hours and has two parts:   The first part is a clinical interview with the neuropsychologist (Dr. Melvyn Novas or Dr. Nicole Kindred). During the interview, the neuropsychologist will speak with you and the individual you brought to the appointment.    The second part of the evaluation is testing with the doctor's technician Hinton Dyer or Maudie Mercury). During the testing, the technician will ask you to remember different types of material, solve problems, and answer some questionnaires. Your family member will not be present for this portion of the evaluation.   Please note: We must reserve several hours of the neuropsychologist's time and the psychometrician's time for your evaluation appointment. As such, there is a No-Show fee of $100. If you are unable to attend any of your appointments, please contact our office as soon as possible to reschedule.    FALL PRECAUTIONS: Be cautious when walking. Scan the area for obstacles that may increase the risk of trips and falls. When getting up in the mornings, sit up at the edge of the bed for a few minutes before getting out of bed. Consider elevating the bed at the head end to avoid drop of blood pressure when getting up. Walk always in a well-lit room (use night lights in the walls). Avoid area rugs or power cords from appliances in the middle of the walkways. Use a walker or a cane if necessary and consider physical therapy for balance exercise. Get your  eyesight checked regularly.  FINANCIAL OVERSIGHT: Supervision, especially oversight when making financial decisions or transactions is also recommended.  HOME SAFETY: Consider the safety of the kitchen when operating appliances like stoves, microwave oven, and blender. Consider having supervision and share cooking responsibilities until no longer able to  participate in those. Accidents with firearms and other hazards in the house should be identified and addressed as well.   ABILITY TO BE LEFT ALONE: If patient is unable to contact 911 operator, consider using LifeLine, or when the need is there, arrange for someone to stay with patients. Smoking is a fire hazard, consider supervision or cessation. Risk of wandering should be assessed by caregiver and if detected at any point, supervision and safe proof recommendations should be instituted.  MEDICATION SUPERVISION: Inability to self-administer medication needs to be constantly addressed. Implement a mechanism to ensure safe administration of the medications.   DRIVING: Regarding driving, in patients with progressive memory problems, driving will be impaired. We advise to have someone else do the driving if trouble finding directions or if minor accidents are reported. Independent driving assessment is available to determine safety of driving.   If you are interested in the driving assessment, you can contact the following:  [employer] in [place]  [place] [phone] or [phone]    [place] refers to food and lifestyle choices that are based on the traditions of countries located on the [employer]. This way of eating has been shown to help prevent certain conditions and improve outcomes for people who have chronic diseases, like kidney disease and heart disease. What are tips for following this plan? Lifestyle  Cook and eat meals together with your family, when possible. Drink enough fluid to keep your urine clear or pale yellow. Be physically active every day. This includes: Aerobic exercise like running or swimming. Leisure activities like gardening, walking, or housework. Get 7-8 hours of sleep each night. If recommended  by your health care provider, drink red wine in moderation. This means 1 glass a day for nonpregnant women and 2 glasses a day for men. A glass of wine equals 5 oz (150 mL). Reading food labels  Check the serving size of packaged foods. For foods such as rice and pasta, the serving size refers to the amount of cooked product, not dry. Check the total fat in packaged foods. Avoid foods that have saturated fat or trans fats. Check the ingredients list for added sugars, such as corn syrup. Shopping  At the grocery store, buy most of your food from the areas near the walls of the store. This includes: Fresh fruits and vegetables (produce). Grains, beans, nuts, and seeds. Some of these may be available in unpackaged forms or large amounts (in bulk). Fresh seafood. Poultry and eggs. Low-fat dairy products. Buy whole ingredients instead of prepackaged foods. Buy fresh fruits and vegetables in-season from local farmers markets. Buy frozen fruits and vegetables in resealable bags. If you do not have access to quality fresh seafood, buy precooked frozen shrimp or canned fish, such as tuna, salmon, or sardines. Buy small amounts of raw or cooked vegetables, salads, or olives from the deli or salad bar at your store. Stock your pantry so you always have certain foods on hand, such as olive oil, canned tuna, canned tomatoes, rice, pasta, and beans. Cooking  Cook foods with extra-virgin olive oil instead of using butter or other  vegetable oils. Have meat as a side dish, and have vegetables or grains as your main dish. This means having meat in small portions or adding small amounts of meat to foods like pasta or stew. Use beans or vegetables instead of meat in common dishes like chili or lasagna. Experiment with different cooking methods. Try roasting or broiling vegetables instead of steaming or sauteing them. Add frozen vegetables to soups, stews, pasta, or rice. Add nuts or seeds for added healthy fat  at each meal. You can add these to yogurt, salads, or vegetable dishes. Marinate fish or vegetables using olive oil, lemon juice, garlic, and fresh herbs. Meal planning  Plan to eat 1 vegetarian meal one day each week. Try to work up to 2 vegetarian meals, if possible. Eat seafood 2 or more times a week. Have healthy snacks readily available, such as: Vegetable sticks with hummus. Greek yogurt. Fruit and nut trail mix. Eat balanced meals throughout the week. This includes: Fruit: 2-3 servings a day Vegetables: 4-5 servings a day Low-fat dairy: 2 servings a day Fish, poultry, or lean meat: 1 serving a day Beans and legumes: 2 or more servings a week Nuts and seeds: 1-2 servings a day Whole grains: 6-8 servings a day Extra-virgin olive oil: 3-4 servings a day Limit red meat and sweets to only a few servings a month What are my food choices? Mediterranean diet Recommended Grains: Whole-grain pasta. Brown rice. Bulgar wheat. Polenta. Couscous. Whole-wheat bread. [place]. Vegetables: Artichokes. Beets. Broccoli. Cabbage. Carrots. Eggplant. Green beans. Chard. Kale. Spinach. Onions. Leeks. Peas. Squash. Tomatoes. Peppers. Radishes. Fruits: Apples. Apricots. Avocado. Berries. Bananas. Cherries. Dates. Figs. Grapes. Lemons. Melon. Oranges. Peaches. Plums. Pomegranate. Meats and other protein foods: Beans. Almonds. Sunflower seeds. Pine nuts. Peanuts. [place]. Salmon. Scallops. Shrimp. [place]. Tilapia. Clams. Oysters. Eggs. Dairy: Low-fat milk. Cheese. Greek yogurt. Beverages: Water. Red wine. Herbal tea. Fats and oils: Extra virgin olive oil. Avocado oil. Grape seed oil. Sweets and desserts: [place] yogurt with honey. Baked apples. Poached pears. Trail mix. Seasoning and other foods: Basil. Cilantro. Coriander. Cumin. Mint. Parsley. Sage. Rosemary. Tarragon. Garlic. Oregano. Thyme. Pepper. Balsalmic vinegar. Tahini. Hummus. Tomato sauce. Olives. Mushrooms. Limit these Grains: Prepackaged  pasta or rice dishes. Prepackaged cereal with added sugar. Vegetables: Deep fried potatoes (french fries). Fruits: Fruit canned in syrup. Meats and other protein foods: Beef. Pork. Lamb. Poultry with skin. Hot dogs. [place] Salines. Dairy: Ice cream. Sour cream. Whole milk. Beverages: Juice. Sugar-sweetened soft drinks. Beer. Liquor and spirits. Fats and oils: Butter. Canola oil. Vegetable oil. Beef fat (tallow). Lard. Sweets and desserts: Cookies. Cakes. Pies. Candy. Seasoning and other foods: Mayonnaise. Premade sauces and marinades. The items listed may not be a complete list. Talk with your dietitian about what dietary choices are right for you. Summary The Mediterranean diet includes both food and lifestyle choices. Eat a variety of fresh fruits and vegetables, beans, nuts, seeds, and whole grains. Limit the amount of red meat and sweets that you eat. Talk with your health care provider about whether it is safe for you to drink red wine in moderation. This means 1 glass a day for nonpregnant women and 2 glasses a day for men. A glass of wine equals 5 oz (150 mL). This information is not intended to replace advice given to you by your health care provider. Make sure you discuss any questions you have with your health care provider. Document Released: 07/09/2016 Document Revised: 08/11/2016 Document Reviewed: 07/09/2016 Elsevier Interactive Patient Education  2017 Elsevier  Inc.  We have sent a referral to [facility] for your MRI and they will call you directly to schedule your appointment. They are located at [place]. If you need to contact them directly please call [phone].   Your provider has requested that you have labwork completed today. Please go to [facility] ([id number]) on the second floor of this building before leaving the office today. You do not need to check in. If you are not called within 15 minutes please check with the front desk.

## 2022-04-30 ENCOUNTER — Telehealth: Payer: Self-pay | Admitting: Physician Assistant

## 2022-04-30 ENCOUNTER — Other Ambulatory Visit: Payer: Self-pay | Admitting: Physician Assistant

## 2022-04-30 MED ORDER — DIAZEPAM 5 MG PO TABS: ORAL_TABLET | ORAL | 0 refills | Status: DC

## 2022-04-30 NOTE — Telephone Encounter (Signed)
Pt called in and left a message. She is supposed to have an MRI on Saturday and is claustrophobic. She is requesting some medication to be called in for this so she can do the MRI. She did not specify pharmacy.

## 2022-05-01 ENCOUNTER — Other Ambulatory Visit: Payer: Self-pay | Admitting: Physician Assistant

## 2022-05-01 MED ORDER — DIAZEPAM 5 MG PO TABS: ORAL_TABLET | ORAL | 0 refills | Status: AC

## 2022-05-01 NOTE — Telephone Encounter (Signed)
Done, thanks

## 2022-05-01 NOTE — Telephone Encounter (Signed)
Patient advised to check at pharmacy, she thanked me for calling

## 2022-05-01 NOTE — Telephone Encounter (Signed)
Patient said her valiums has not been called in to [facility] on [address] yet. Her appt is for tomorrow. Please call her

## 2022-05-02 ENCOUNTER — Ambulatory Visit
Admission: RE | Admit: 2022-05-02 | Discharge: 2022-05-02 | Disposition: A | Discharge status: Home / Self Care | Payer: BC Managed Care – PPO | Source: Ambulatory Visit | Attending: Physician Assistant | Admitting: Physician Assistant

## 2022-05-02 DIAGNOSIS — R9082 White matter disease, unspecified: Secondary | ICD-10-CM

## 2022-05-02 MED ORDER — GADOBENATE DIMEGLUMINE 529 MG/ML IV SOLN
15.0000 mL | Freq: Once | INTRAVENOUS | Status: AC | PRN
  Administered 2022-05-02: 15 mL via INTRAVENOUS

## 2022-05-05 NOTE — Progress Notes (Signed)
Please inform the patient that, Compared to report from MRI of 2010, again findings on brain MRI appear stable.  No evidence of demyelinating lesions involving spinal cord such as MS.  Findings on brain MRI are nonspecific and may be chronic small vessel ischemic changes in a person with known hypertension and hyperlipidemia. thanks

## 2022-05-15 ENCOUNTER — Telehealth: Payer: Self-pay | Admitting: Physician Assistant

## 2022-05-15 ENCOUNTER — Encounter: Payer: Self-pay | Admitting: Physician Assistant

## 2022-05-15 NOTE — Telephone Encounter (Signed)
Called pt and informed her to keep appointment

## 2022-05-15 NOTE — Telephone Encounter (Signed)
Patient called and said her MRI results were normal. She'd like to know if she should keep her scheduled appointment with Sharene Butters, PA on 05/20/22?

## 2022-05-20 ENCOUNTER — Encounter: Payer: Self-pay | Admitting: Physician Assistant

## 2022-05-20 ENCOUNTER — Ambulatory Visit (INDEPENDENT_AMBULATORY_CARE_PROVIDER_SITE_OTHER): Payer: BC Managed Care – PPO | Admitting: Physician Assistant

## 2022-05-20 VITALS — BP 119/80 | HR 69 | Resp 18 | Wt 178.0 lb

## 2022-05-20 DIAGNOSIS — R413 Other amnesia: Secondary | ICD-10-CM | POA: Diagnosis not present

## 2022-05-20 DIAGNOSIS — R9082 White matter disease, unspecified: Secondary | ICD-10-CM

## 2022-05-20 NOTE — Patient Instructions (Signed)
It was a pleasure to see you today at our office.   Recommendations: Follow up with dermatoogist regarding the skin issues Consider CBT for management of stress and anxiety  Take good care of the cardiovascular risk factors    RECOMMENDATIONS FOR ALL PATIENTS WITH MEMORY PROBLEMS: 1. Continue to exercise (Recommend 30 minutes of walking everyday, or 3 hours every week) 2. Increase social interactions - continue going to [place] and enjoy social gatherings with friends and family 3. Eat healthy, avoid fried foods and eat more fruits and vegetables 4. Maintain adequate blood pressure, blood sugar, and blood cholesterol level. Reducing the risk of stroke and cardiovascular disease also helps promoting better memory. 5. Avoid stressful situations. Live a simple life and avoid aggravations. Organize your time and prepare for the next day in anticipation. 6. Sleep well, avoid any interruptions of sleep and avoid any distractions in the bedroom that may interfere with adequate sleep quality 7. Avoid sugar, avoid sweets as there is a strong link between excessive sugar intake, diabetes, and cognitive impairment We discussed the Mediterranean diet, which has been shown to help patients reduce the risk of progressive memory disorders and reduces cardiovascular risk. This includes eating fish, eat fruits and green leafy vegetables, nuts like almonds and hazelnuts, walnuts, and also use olive oil. Avoid fast foods and fried foods as much as possible. Avoid sweets and sugar as sugar use has been linked to worsening of memory function.  There is always a concern of gradual progression of memory problems. If this is the case, then we may need to adjust level of care according to patient needs. Support, both to the patient and caregiver, should then be put into place.      You have been referred for a neuropsychological evaluation (i.e., evaluation of memory and thinking abilities). Please bring someone with  you to this appointment if possible, as it is helpful for the doctor to hear from both you and another adult who knows you well. Please bring eyeglasses and hearing aids if you wear them.    The evaluation will take approximately 3 hours and has two parts:   The first part is a clinical interview with the neuropsychologist (Dr. Melvyn Novas or Dr. Nicole Kindred). During the interview, the neuropsychologist will speak with you and the individual you brought to the appointment.    The second part of the evaluation is testing with the doctor's technician Hinton Dyer or Maudie Mercury). During the testing, the technician will ask you to remember different types of material, solve problems, and answer some questionnaires. Your family member will not be present for this portion of the evaluation.   Please note: We must reserve several hours of the neuropsychologist's time and the psychometrician's time for your evaluation appointment. As such, there is a No-Show fee of $100. If you are unable to attend any of your appointments, please contact our office as soon as possible to reschedule.    FALL PRECAUTIONS: Be cautious when walking. Scan the area for obstacles that may increase the risk of trips and falls. When getting up in the mornings, sit up at the edge of the bed for a few minutes before getting out of bed. Consider elevating the bed at the head end to avoid drop of blood pressure when getting up. Walk always in a well-lit room (use night lights in the walls). Avoid area rugs or power cords from appliances in the middle of the walkways. Use a walker or a cane if necessary and consider  physical therapy for balance exercise. Get your eyesight checked regularly.  FINANCIAL OVERSIGHT: Supervision, especially oversight when making financial decisions or transactions is also recommended.  HOME SAFETY: Consider the safety of the kitchen when operating appliances like stoves, microwave oven, and blender. Consider having supervision and  share cooking responsibilities until no longer able to participate in those. Accidents with firearms and other hazards in the house should be identified and addressed as well.   ABILITY TO BE LEFT ALONE: If patient is unable to contact 911 operator, consider using LifeLine, or when the need is there, arrange for someone to stay with patients. Smoking is a fire hazard, consider supervision or cessation. Risk of wandering should be assessed by caregiver and if detected at any point, supervision and safe proof recommendations should be instituted.  MEDICATION SUPERVISION: Inability to self-administer medication needs to be constantly addressed. Implement a mechanism to ensure safe administration of the medications.   DRIVING: Regarding driving, in patients with progressive memory problems, driving will be impaired. We advise to have someone else do the driving if trouble finding directions or if minor accidents are reported. Independent driving assessment is available to determine safety of driving.   If you are interested in the driving assessment, you can contact the following:  [employer] in [place]  [place] [phone] or [phone]    [place] refers to food and lifestyle choices that are based on the traditions of countries located on the [employer]. This way of eating has been shown to help prevent certain conditions and improve outcomes for people who have chronic diseases, like kidney disease and heart disease. What are tips for following this plan? Lifestyle  Cook and eat meals together with your family, when possible. Drink enough fluid to keep your urine clear or pale yellow. Be physically active every day. This includes: Aerobic exercise like running or swimming. Leisure activities like gardening, walking, or  housework. Get 7-8 hours of sleep each night. If recommended by your health care provider, drink red wine in moderation. This means 1 glass a day for nonpregnant women and 2 glasses a day for men. A glass of wine equals 5 oz (150 mL). Reading food labels  Check the serving size of packaged foods. For foods such as rice and pasta, the serving size refers to the amount of cooked product, not dry. Check the total fat in packaged foods. Avoid foods that have saturated fat or trans fats. Check the ingredients list for added sugars, such as corn syrup. Shopping  At the grocery store, buy most of your food from the areas near the walls of the store. This includes: Fresh fruits and vegetables (produce). Grains, beans, nuts, and seeds. Some of these may be available in unpackaged forms or large amounts (in bulk). Fresh seafood. Poultry and eggs. Low-fat dairy products. Buy whole ingredients instead of prepackaged foods. Buy fresh fruits and vegetables in-season from local farmers markets. Buy frozen fruits and vegetables in resealable bags. If you do not have access to quality fresh seafood, buy precooked frozen shrimp or canned fish, such as tuna, salmon, or sardines. Buy small amounts of raw or cooked vegetables, salads, or olives from the deli or salad bar at your store. Stock your pantry so you always have certain foods on hand, such as olive oil, canned tuna, canned tomatoes, rice, pasta, and beans. Cooking  Cook foods with extra-virgin olive  oil instead of using butter or other vegetable oils. Have meat as a side dish, and have vegetables or grains as your main dish. This means having meat in small portions or adding small amounts of meat to foods like pasta or stew. Use beans or vegetables instead of meat in common dishes like chili or lasagna. Experiment with different cooking methods. Try roasting or broiling vegetables instead of steaming or sauteing them. Add frozen vegetables to soups,  stews, pasta, or rice. Add nuts or seeds for added healthy fat at each meal. You can add these to yogurt, salads, or vegetable dishes. Marinate fish or vegetables using olive oil, lemon juice, garlic, and fresh herbs. Meal planning  Plan to eat 1 vegetarian meal one day each week. Try to work up to 2 vegetarian meals, if possible. Eat seafood 2 or more times a week. Have healthy snacks readily available, such as: Vegetable sticks with hummus. Greek yogurt. Fruit and nut trail mix. Eat balanced meals throughout the week. This includes: Fruit: 2-3 servings a day Vegetables: 4-5 servings a day Low-fat dairy: 2 servings a day Fish, poultry, or lean meat: 1 serving a day Beans and legumes: 2 or more servings a week Nuts and seeds: 1-2 servings a day Whole grains: 6-8 servings a day Extra-virgin olive oil: 3-4 servings a day Limit red meat and sweets to only a few servings a month What are my food choices? Mediterranean diet Recommended Grains: Whole-grain pasta. Brown rice. Bulgar wheat. Polenta. Couscous. Whole-wheat bread. [place]. Vegetables: Artichokes. Beets. Broccoli. Cabbage. Carrots. Eggplant. Green beans. Chard. Kale. Spinach. Onions. Leeks. Peas. Squash. Tomatoes. Peppers. Radishes. Fruits: Apples. Apricots. Avocado. Berries. Bananas. Cherries. Dates. Figs. Grapes. Lemons. Melon. Oranges. Peaches. Plums. Pomegranate. Meats and other protein foods: Beans. Almonds. Sunflower seeds. Pine nuts. Peanuts. [place]. Salmon. Scallops. Shrimp. [place]. Tilapia. Clams. Oysters. Eggs. Dairy: Low-fat milk. Cheese. Greek yogurt. Beverages: Water. Red wine. Herbal tea. Fats and oils: Extra virgin olive oil. Avocado oil. Grape seed oil. Sweets and desserts: [place] yogurt with honey. Baked apples. Poached pears. Trail mix. Seasoning and other foods: Basil. Cilantro. Coriander. Cumin. Mint. Parsley. Sage. Rosemary. Tarragon. Garlic. Oregano. Thyme. Pepper. Balsalmic vinegar. Tahini. Hummus. Tomato  sauce. Olives. Mushrooms. Limit these Grains: Prepackaged pasta or rice dishes. Prepackaged cereal with added sugar. Vegetables: Deep fried potatoes (french fries). Fruits: Fruit canned in syrup. Meats and other protein foods: Beef. Pork. Lamb. Poultry with skin. Hot dogs. [place] Salines. Dairy: Ice cream. Sour cream. Whole milk. Beverages: Juice. Sugar-sweetened soft drinks. Beer. Liquor and spirits. Fats and oils: Butter. Canola oil. Vegetable oil. Beef fat (tallow). Lard. Sweets and desserts: Cookies. Cakes. Pies. Candy. Seasoning and other foods: Mayonnaise. Premade sauces and marinades. The items listed may not be a complete list. Talk with your dietitian about what dietary choices are right for you. Summary The Mediterranean diet includes both food and lifestyle choices. Eat a variety of fresh fruits and vegetables, beans, nuts, seeds, and whole grains. Limit the amount of red meat and sweets that you eat. Talk with your health care provider about whether it is safe for you to drink red wine in moderation. This means 1 glass a day for nonpregnant women and 2 glasses a day for men. A glass of wine equals 5 oz (150 mL). This information is not intended to replace advice given to you by your health care provider. Make sure you discuss any questions you have with your health care provider. Document Released: 07/09/2016 Document Revised: 08/11/2016 Document Reviewed: 07/09/2016  Elsevier Interactive Patient Education  [employer].  We have sent a referral to [facility] for your MRI and they will call you directly to schedule your appointment. They are located at [address]. If you need to contact them directly please call [phone].   Your provider has requested that you have labwork completed today. Please go to [facility] ([id number]) on the second floor of this building before leaving the office today. You do not need to check in. If you are not called within 15  minutes please check with the front desk.

## 2022-05-20 NOTE — Progress Notes (Signed)
Assessment/Plan:   Memory difficulties  51 y.o. year old RH female with  a history of hypertension, hyperlipidemia, anxiety, depression seen today in follow up for evaluation of memory difficulties. Last MoCA on 02/26/22 was 28/30.  Since her last visit, her memory is stable, without any worsening symptoms.  She is also reporting better control of her high blood pressure and her cardiovascular risk factors, she is now on a statin.  MRI brain on 04/03/22 reviewed by me showed no acute abnormalities but non specific white matter abnormalities bilaterally, similar to those found in 2010 likely chronic small vessel ischemic changes in a person with known hypertension and hyperlipidemia.  As she complained of right leg mild paresthesia and itching especially on the thigh area, fogginess and neck spasms, clumsiness and chronic intermittent left positional vertigo, an MRI of the C and T-spine without evidence of demyelinating lesions involving the spinal cord.  There is low suspicion for MS at this time.  All findings were discussed with the patient.  Follow-up as needed Consider starting psychotherapy-CBT for anxiety   Case discussed with Dr. Delice Lesch who agrees with the plan     Subjective:    Brittney Riley is a very pleasant 51 y.o. RH female  seen today in follow up for memory loss. This patient is here alone.  Previous records as well as any outside records available were reviewed prior to todays visit.  Patient was last seen at our office on 04/16/2022.  Any changes in memory since last visit?  She reports that her memory is about the same, "symptoms have not worse" she continues to read different books, including Bible Patient lives with: Spouse  repeats oneself?  Patient denies Disoriented when walking into a room?  Occasionally  leaving objects in unusual places?  Patient denies   Ambulates  with difficulty?  Patient denies, but at times she feels clumsy on her right foot. Recent  falls?  Patient denies   Any head injuries?  Patient denies   History of seizures?   Patient denies   Wandering behavior?  Patient denies   Patient drives?  Endorsed, she denies any incidents with her car.   Any mood changes such irritability agitation?  Patient denies   Any history of depression?:  Patient denies   Hallucinations?  Patient denies   Paranoia?  Patient denies   Patient reports that he sleeps well without vivid dreams, REM behavior or sleepwalking   History of sleep apnea?  Patient denies   Any hygiene concerns?  Patient denies   Independent of bathing and dressing?  Endorsed  Does the patient needs help with medications?  Patient is in charge Who is in charge of the finances?  Patient is in charge   Any changes in appetite?  Patient denies   Patient have trouble swallowing? Patient denies   Does the patient cook?  Patient denies   Any kitchen accidents such as leaving the stove on? Patient denies   Any headaches?  Patient denies   The double vision? Patient denies   Any focal numbness or tingling?  On the right thigh, she experiences some paresthesias and itching sensation, without a rash. The symptoms appear when she is stressed.   Chronic back pain Patient denies   Unilateral weakness?  Patient denies   Any tremors?  Patient denies   Any history of anosmia?  Patient denies   Any incontinence of urine?  The patient reports urge incontinence and stress incontinence.  Any bowel dysfunction?   Patient denies    Initial Visit 02/26/22 patient is seen in neurologic consultation at the request of Harless Litten, NP for the evaluation of memory.  The patient is accompanied by her sister who supplements the history. This is a 51 y.o. year old RH  female who has had memory issues for about 2 or 3 years, when she began to forget names of people, repeating himself, and reporting "foggy brain ".  She feels clumsier than before, but denies any falls or head injuries.  She denies  being disoriented when walking into her room, or leaving objects in unusual places.  She ambulates most of the time without difficulty, although occasionally she has some clumsiness on both legs.  She also developed tingling, on the right lower extremity, which has ascended to above the knee, and skin itching without a rash.  She continues to drive, occasionally needs she becomes disoriented to where to go.  She has a history of depression, but this has been stable.  She reports being "crankier than before, especially since menopause ".  Occasionally she has hot flashes.  She enjoys doing crossword puzzles, word finding, and likes to read.  She does not sleep well, tosses and turns for the few years, tried melatonin without relief.  She denies vivid dreams or sleepwalking, hallucinations or paranoia.  There are no hygiene concerns, she is independent of bathing and dressing.  Her husband puts the medications on it, and she is compliant with them.  She is in charge of her finances.  She has good appetite, denies trouble swallowing, but at times she feels that "I am wearing a turtleneck that is tight, I feel when I am swallowing ".  She denies any regurgitation.  She does not cook.  She denies any headaches, double vision but she does report less acuity than before, has a harder time focusing which hinders her reading.  She denies any dizziness, tremors or anosmia.  No history of seizures.  In the recent past, she had several issues with urgency, but lately she denies any urine incontinence or any recent infections.  She denies any constipation or diarrhea.    She has a history of positional vertigo, for which she was seen by vestibular therapy, "but it did not help ".  Of note, she was told about 10 years ago, when experiencing left-sided numbness and weakness, that there was a concern for MS based on brain MRI, but she refused to do a spinal tap.  She had another episode of left leg heaviness in July 2018, lasting  for about 10 seconds, which was unusual for MS flare.  She was seen at our office, MRI of the brain with and without contrast was ordered, which was still concerning for MS.  EEG was negative.  MRI of the cervical and thoracic spine were negative.  LP was recommended, but patient again declined. 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 She denies a history of sleep apnea, alcohol or tobacco.  Family history negative for dementia or MS   08/27/17 MRI brain w and wo contrast  Several bilateral periventricular T2 hyperintense lesions. The pattern is suggestive of a demyelinating process. There are no acute features.       MRI brain 04/03/22 . No acute intracranial abnormality.2. Moderately advanced signal abnormality in the bilateral cerebral white matter is in a nonspecific configuration.  Similar findings parietal white matter abnormality reported in 2010. Differential considerations include accelerated/hereditary small vessel ischemia, sequelae of hypercoagulable state, vasculitis, migraines, prior infection, or demyelination.   MRI T spine6/3/23: normal appearance, no cord signal changes to suggest demyelinating disease.  No abnormal and constant.  No significant disc pathology, stenosis or evidence for neural impingement.   MRI C spine 05/02/22 Normal MRI appearance of the cervical spinal cord. No evidence for demyelinating disease.2. Mild disc bulge with uncovertebral hypertrophy at C5-6 with resultant moderate left worse than right C6 foraminal stenosis.3. Minimal disc bulge with endplate spurring at S0-6 with resultant mild left C7 foraminal stenosis.   Past Medical History:  Diagnosis Date   Colon polyps 04/25/2015   Tubular adenomas   GERD (gastroesophageal reflux disease)    Vertigo    Vitamin B deficiency    Vitamin D deficiency      Past Surgical History:  Procedure Laterality Date    ABDOMINAL HYSTERECTOMY  2012   APPENDECTOMY  1986   BREAST BIOPSY Left 2016   [place], 2002   COLONOSCOPY  2016   HERNIA REPAIR  3016   umbilical   OVARIAN CYST REMOVAL  1993     PREVIOUS MEDICATIONS:   CURRENT MEDICATIONS:  Outpatient Encounter Medications as of 05/20/2022  Medication Sig   estradiol (ESTRACE) 0.1 MG/GM vaginal cream estradiol 0.01% (0.1 mg/gram) vaginal cream  INSERT 1 G TWICE A WEEK BY VAGINAL ROUTE AT BEDTIME.   Multiple Vitamins-Minerals (HM MULTIVITAMIN ADULT GUMMY PO) Take by mouth.   rosuvastatin (CRESTOR) 20 MG tablet Take 20 mg by mouth daily.   diazepam (VALIUM) 5 MG tablet Take 1 tab 30 min prior to procedure, may take a second tab if needed   Facility-Administered Encounter Medications as of 05/20/2022  Medication   0.9 %  sodium chloride infusion     Objective:     PHYSICAL EXAMINATION:    VITALS:   Vitals:   05/20/22 0927  BP: 119/80  Pulse: 69  Resp: 18  SpO2: 97%  Weight: 178 lb (80.7 kg)    GEN:  The patient appears stated age and is in NAD. HEENT:  Normocephalic, atraumatic.   Neurological examination:  General: NAD, well-groomed, appears stated age. Orientation: The patient is alert. Oriented to person, place and date Cranial nerves: There is good facial symmetry.The speech is fluent and clear. No aphasia or dysarthria. Fund of knowledge is appropriate. Recent memory impaired and remote memory is normal.  Attention and concentration are normal.  Able to name objects and repeat phrases.  Hearing is intact to conversational tone.    Sensation: Sensation is intact to light touch throughout Motor: Strength is at least antigravity x4. Tremors: none  DTR's 2/4 in UE/LE      02/26/2022   12:00 PM  Montreal Cognitive Assessment   Visuospatial/ Executive (0/5) 4  Naming (0/3) 3  Attention: Read list of digits (0/2) 2  Attention: Read list of letters (0/1) 1  Attention: Serial 7 subtraction starting at 100 (0/3) 3   Language: Repeat phrase (0/2) 2  Language : Fluency (0/1) 1  Abstraction (0/2) 2  Delayed Recall (0/5) 4  Orientation (0/6) 6  Total 28  Adjusted Score (based on education) 28       08/04/2017    9:00 AM  MMSE - Mini Mental State Exam  Orientation to time 5  Orientation to Place 5  Registration 3  Attention/ Calculation 5  Recall 2  Language- name  2 objects 2  Language- repeat 1  Language- follow 3 step command 3  Language- read & follow direction 1  Write a sentence 1  Copy design 1  Total score 29       Movement examination: Tone: There is normal tone in the UE/LE Abnormal movements:  no tremor.  No myoclonus.  No asterixis.   Coordination:  There is no decremation with RAM's. Normal finger to nose  Gait and Station: The patient has no difficulty arising out of a deep-seated chair without the use of the hands. The patient's stride length is good.  Gait is cautious and narrow.   Thank you for allowing [place] the opportunity to participate in the care of this nice patient. Please do not hesitate to contact us for any questions or concerns.   Total time spent on today's visit was 35 minutes dedicated to this patient today, preparing to see patient, examining the patient, ordering tests and/or medications and counseling the patient, documenting clinical information in the EHR or other health record, independently interpreting results and communicating results to the patient/family, discussing treatment and goals, answering patient's questions and coordinating care.  Cc:  Everardo Beals, NP  Sharene Butters 05/21/2022 3:23 PM

## 2022-08-13 ENCOUNTER — Encounter (HOSPITAL_COMMUNITY): Payer: Self-pay

## 2022-08-13 ENCOUNTER — Other Ambulatory Visit: Payer: Self-pay

## 2022-08-13 ENCOUNTER — Emergency Department (HOSPITAL_COMMUNITY)
Admission: EM | Admit: 2022-08-13 | Discharge: 2022-08-14 | Disposition: A | Discharge status: Home / Self Care | Payer: BC Managed Care – PPO | Attending: Emergency Medicine | Admitting: Emergency Medicine

## 2022-08-13 ENCOUNTER — Emergency Department (HOSPITAL_COMMUNITY): Payer: BC Managed Care – PPO

## 2022-08-13 DIAGNOSIS — R079 Chest pain, unspecified: Secondary | ICD-10-CM

## 2022-08-13 DIAGNOSIS — M25539 Pain in unspecified wrist: Secondary | ICD-10-CM | POA: Insufficient documentation

## 2022-08-13 DIAGNOSIS — S90112A Contusion of left great toe without damage to nail, initial encounter: Secondary | ICD-10-CM | POA: Diagnosis not present

## 2022-08-13 DIAGNOSIS — S40012A Contusion of left shoulder, initial encounter: Secondary | ICD-10-CM | POA: Diagnosis not present

## 2022-08-13 DIAGNOSIS — M545 Low back pain, unspecified: Secondary | ICD-10-CM | POA: Diagnosis not present

## 2022-08-13 DIAGNOSIS — S134XXA Sprain of ligaments of cervical spine, initial encounter: Secondary | ICD-10-CM | POA: Diagnosis not present

## 2022-08-13 DIAGNOSIS — Y92410 Unspecified street and highway as the place of occurrence of the external cause: Secondary | ICD-10-CM | POA: Diagnosis not present

## 2022-08-13 DIAGNOSIS — R0789 Other chest pain: Secondary | ICD-10-CM | POA: Diagnosis not present

## 2022-08-13 DIAGNOSIS — S4992XA Unspecified injury of left shoulder and upper arm, initial encounter: Secondary | ICD-10-CM | POA: Diagnosis present

## 2022-08-13 DIAGNOSIS — M79675 Pain in left toe(s): Secondary | ICD-10-CM
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
## 2022-08-14 ENCOUNTER — Emergency Department (HOSPITAL_COMMUNITY): Payer: BC Managed Care – PPO

## 2022-08-14 LAB — I-STAT CREATININE, ED: Creatinine, Ser: 0.8 mg/dL (ref 0.44–1.00)

## 2022-08-14 MED ORDER — SODIUM CHLORIDE (PF) 0.9 % IJ SOLN
INTRAMUSCULAR | Status: AC
  Filled 2022-08-14: qty 50

## 2022-08-14 MED ORDER — HYDROCODONE-ACETAMINOPHEN 5-325 MG PO TABS
1.0000 | ORAL_TABLET | Freq: Once | ORAL | Status: AC
  Administered 2022-08-14: 1 via ORAL
  Filled 2022-08-14: qty 1

## 2022-08-14 MED ORDER — IOHEXOL 300 MG/ML  SOLN
75.0000 mL | Freq: Once | INTRAMUSCULAR | Status: AC | PRN
  Administered 2022-08-14: 75 mL via INTRAVENOUS

## 2022-08-14 MED ORDER — KETOROLAC TROMETHAMINE 10 MG PO TABS: 10.0000 mg | ORAL_TABLET | Freq: Four times a day (QID) | ORAL | 0 refills | Status: AC | PRN

## 2022-08-14 MED ORDER — CYCLOBENZAPRINE HCL 10 MG PO TABS: 5.0000 mg | ORAL_TABLET | Freq: Three times a day (TID) | ORAL | 0 refills | Status: AC | PRN

## 2022-08-14 MED ORDER — CYCLOBENZAPRINE HCL 10 MG PO TABS
10.0000 mg | ORAL_TABLET | Freq: Once | ORAL | Status: AC
Start: 2022-08-14 — End: 2022-08-14
  Administered 2022-08-14: 10 mg via ORAL
  Filled 2022-08-14: qty 1
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
## 2022-08-14 NOTE — Discharge Instructions (Signed)
Please follow-up with your PCP. Do not take Flexeril if you will be driving or operating other heavy machinery. Use tylenol to supplement for pain control. Do not take ibuprofen or other NSAIDs with the ketorolac.

## 2022-08-14 NOTE — ED Notes (Signed)
Pt to xray

## 2022-08-14 NOTE — ED Notes (Signed)
Urine preg d/c'd per pt request, pt has had a hysterectomy.

## 2022-11-03 ENCOUNTER — Ambulatory Visit (INDEPENDENT_AMBULATORY_CARE_PROVIDER_SITE_OTHER): Payer: BC Managed Care – PPO

## 2022-11-03 ENCOUNTER — Ambulatory Visit: Payer: BC Managed Care – PPO | Admitting: Podiatry

## 2022-11-03 DIAGNOSIS — B351 Tinea unguium: Secondary | ICD-10-CM | POA: Diagnosis not present

## 2022-11-03 DIAGNOSIS — T148XXA Other injury of unspecified body region, initial encounter: Secondary | ICD-10-CM | POA: Diagnosis not present

## 2022-11-03 DIAGNOSIS — M722 Plantar fascial fibromatosis: Secondary | ICD-10-CM | POA: Diagnosis not present

## 2022-11-03 DIAGNOSIS — S92505A Nondisplaced unspecified fracture of left lesser toe(s), initial encounter for closed fracture: Secondary | ICD-10-CM

## 2022-11-03 DIAGNOSIS — M79672 Pain in left foot: Secondary | ICD-10-CM

## 2022-11-03 MED ORDER — EFINACONAZOLE 10 % EX SOLN: 1.0000 [drp] | Freq: Every day | CUTANEOUS | 11 refills | Status: AC
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
## 2022-11-03 NOTE — Patient Instructions (Addendum)
Plantar Fasciitis (Heel Spur Syndrome) with Rehab The plantar fascia is a fibrous, ligament-like, soft-tissue structure that spans the bottom of the foot. Plantar fasciitis is a condition that causes pain in the foot due to inflammation of the tissue. SYMPTOMS   Pain and tenderness on the underneath side of the foot.  Pain that worsens with standing or walking. CAUSES  Plantar fasciitis is caused by irritation and injury to the plantar fascia on the underneath side of the foot. Common mechanisms of injury include:  Direct trauma to bottom of the foot.  Damage to a small nerve that runs under the foot where the main fascia attaches to the heel bone.  Stress placed on the plantar fascia due to bone spurs. RISK INCREASES WITH:   Activities that place stress on the plantar fascia (running, jumping, pivoting, or cutting).  Poor strength and flexibility.  Improperly fitted shoes.  Tight calf muscles.  Flat feet.  Failure to warm-up properly before activity.  Obesity. PREVENTION  Warm up and stretch properly before activity.  Allow for adequate recovery between workouts.  Maintain physical fitness:  Strength, flexibility, and endurance.  Cardiovascular fitness.  Maintain a health body weight.  Avoid stress on the plantar fascia.  Wear properly fitted shoes, including arch supports for individuals who have flat feet.  PROGNOSIS  If treated properly, then the symptoms of plantar fasciitis usually resolve without surgery. However, occasionally surgery is necessary.  RELATED COMPLICATIONS   Recurrent symptoms that may result in a chronic condition.  Problems of the lower back that are caused by compensating for the injury, such as limping.  Pain or weakness of the foot during push-off following surgery.  Chronic inflammation, scarring, and partial or complete fascia tear, occurring more often from repeated injections.  TREATMENT  Treatment initially involves the  use of ice and medication to help reduce pain and inflammation. The use of strengthening and stretching exercises may help reduce pain with activity, especially stretches of the Achilles tendon. These exercises may be performed at home or with a therapist. Your caregiver may recommend that you use heel cups of arch supports to help reduce stress on the plantar fascia. Occasionally, corticosteroid injections are given to reduce inflammation. If symptoms persist for greater than 6 months despite non-surgical (conservative), then surgery may be recommended.   MEDICATION   If pain medication is necessary, then nonsteroidal anti-inflammatory medications, such as aspirin and ibuprofen, or other minor pain relievers, such as acetaminophen, are often recommended.  Do not take pain medication within 7 days before surgery.  Prescription pain relievers may be given if deemed necessary by your caregiver. Use only as directed and only as much as you need.  Corticosteroid injections may be given by your caregiver. These injections should be reserved for the most serious cases, because they may only be given a certain number of times.  HEAT AND COLD  Cold treatment (icing) relieves pain and reduces inflammation. Cold treatment should be applied for 10 to 15 minutes every 2 to 3 hours for inflammation and pain and immediately after any activity that aggravates your symptoms. Use ice packs or massage the area with a piece of ice (ice massage).  Heat treatment may be used prior to performing the stretching and strengthening activities prescribed by your caregiver, physical therapist, or athletic trainer. Use a heat pack or soak the injury in warm water.  SEEK IMMEDIATE MEDICAL CARE IF:  Treatment seems to offer no benefit, or the condition worsens.  Any medications   produce adverse side effects.  EXERCISES- RANGE OF MOTION (ROM) AND STRETCHING EXERCISES - Plantar Fasciitis (Heel Spur Syndrome) These exercises  may help you when beginning to rehabilitate your injury. Your symptoms may resolve with or without further involvement from your physician, physical therapist or athletic trainer. While completing these exercises, remember:   Restoring tissue flexibility helps normal motion to return to the joints. This allows healthier, less painful movement and activity.  An effective stretch should be held for at least 30 seconds.  A stretch should never be painful. You should only feel a gentle lengthening or release in the stretched tissue.  RANGE OF MOTION - Toe Extension, Flexion  Sit with your right / left leg crossed over your opposite knee.  Grasp your toes and gently pull them back toward the top of your foot. You should feel a stretch on the bottom of your toes and/or foot.  Hold this stretch for 10 seconds.  Now, gently pull your toes toward the bottom of your foot. You should feel a stretch on the top of your toes and or foot.  Hold this stretch for 10 seconds. Repeat  times. Complete this stretch 3 times per day.   RANGE OF MOTION - Ankle Dorsiflexion, Active Assisted  Remove shoes and sit on a chair that is preferably not on a carpeted surface.  Place right / left foot under knee. Extend your opposite leg for support.  Keeping your heel down, slide your right / left foot back toward the chair until you feel a stretch at your ankle or calf. If you do not feel a stretch, slide your bottom forward to the edge of the chair, while still keeping your heel down.  Hold this stretch for 10 seconds. Repeat 3 times. Complete this stretch 2 times per day.   STRETCH  Gastroc, Standing  Place hands on wall.  Extend right / left leg, keeping the front knee somewhat bent.  Slightly point your toes inward on your back foot.  Keeping your right / left heel on the floor and your knee straight, shift your weight toward the wall, not allowing your back to arch.  You should feel a gentle stretch  in the right / left calf. Hold this position for 10 seconds. Repeat 3 times. Complete this stretch 2 times per day.  STRETCH  Soleus, Standing  Place hands on wall.  Extend right / left leg, keeping the other knee somewhat bent.  Slightly point your toes inward on your back foot.  Keep your right / left heel on the floor, bend your back knee, and slightly shift your weight over the back leg so that you feel a gentle stretch deep in your back calf.  Hold this position for 10 seconds. Repeat 3 times. Complete this stretch 2 times per day.  STRETCH  Gastrocsoleus, Standing  Note: This exercise can place a lot of stress on your foot and ankle. Please complete this exercise only if specifically instructed by your caregiver.   Place the ball of your right / left foot on a step, keeping your other foot firmly on the same step.  Hold on to the wall or a rail for balance.  Slowly lift your other foot, allowing your body weight to press your heel down over the edge of the step.  You should feel a stretch in your right / left calf.  Hold this position for 10 seconds.  Repeat this exercise with a slight bend in your right /   gain both the endurance and the strength needed for everyday activities through controlled exercises. Complete these exercises as instructed by your physician, physical therapist or athletic trainer. Progress the resistance and repetitions only as guided.  STRENGTH - Towel Curls Sit in a chair positioned on a non-carpeted surface. Place your foot on a towel, keeping your heel on the floor. Pull the towel toward your heel by only curling your toes. Keep your heel on the floor. Repeat 3 times.  Complete this exercise 2 times per day.  STRENGTH - Ankle Inversion Secure one end of a rubber exercise band/tubing to a fixed object (table, pole). Loop the other end around your foot just before your toes. Place your fists between your knees. This will focus your strengthening at your ankle. Slowly, pull your big toe up and in, making sure the band/tubing is positioned to resist the entire motion. Hold this position for 10 seconds. Have your muscles resist the band/tubing as it slowly pulls your foot back to the starting position. Repeat 3 times. Complete this exercises 2 times per day.  Document Released: 11/16/2005 Document Revised: 02/08/2012 Document Reviewed: 02/28/2009 [facility] Patient Information 2014 [place], [place].   Efinaconazole Topical Solution What is this medication? EFINACONAZOLE (e FEE na KON a zole) treats fungal infections of the nails. It belongs to a group of medications called antifungals. It will not treat infections caused by bacteria or viruses. This medicine may be used for other purposes; ask your health care provider or pharmacist if you have questions. COMMON BRAND NAME(S): JUBLIA What should I tell my care team before I take this medication? They need to know if you have any of these conditions: An unusual or allergic reaction to efinaconazole, other medications, foods, dyes or preservatives Pregnant or trying to get pregnant Breast-feeding How should I use this medication? This medication is for external use only. Do not take by mouth. Wash your hands before and after use. Do not get it in your eyes. If you do, rinse your eyes with plenty of cool tap water. Use it as directed on the prescription label. Do not use it more often than directed. Use the medication for the full course as directed by your care team, even if you think you are better. Do not stop using it unless your care team tells you to stop it early. This medication comes with INSTRUCTIONS FOR  USE. Ask your pharmacist for directions on how to use this medication. Read the information carefully. Talk to your pharmacist or care team if you have questions. Talk to your care team about the use of this medication in children. While it may be prescribed for children as young as 6 years for selected conditions, precautions do apply. Overdosage: If you think you have taken too much of this medicine contact a poison control center or emergency room at once. NOTE: This medicine is only for you. Do not share this medicine with others. What if I miss a dose? If you miss a dose, use it as soon as you can. If it is almost time for your next dose, use only that dose. Do not use double or extra doses. What may interact with this medication? Interactions are not expected. Do not use any other skin products on the same area of skin without talking to your care team. This list may not describe all possible interactions. Give your health care provider a list of all the medicines, herbs, non-prescription drugs, or dietary supplements you use. Also tell  them if you smoke, drink alcohol, or use illegal drugs. Some items may interact with your medicine. What should I watch for while using this medication? Visit your care team for regular checks on your progress. It may be some time before you see the benefit from this medication. Do not use nail polish or other nail cosmetic products on the treated nails. What side effects may I notice from receiving this medication? Side effects that you should report to your care team as soon as possible: Allergic reactions--skin rash, itching, hives, swelling of the face, lips, tongue, or throat Side effects that usually do not require medical attention (report to your care team if they continue or are bothersome): Ingrown nails Mild skin irritation, redness, or dryness This list may not describe all possible side effects. Call your doctor for medical advice about side  effects. You may report side effects to FDA at 1-800-FDA-1088. Where should I keep my medication? Keep out of the reach of children and pets. Store at room temperature between 20 and 25 degrees C (68 and 77 degrees F). Do not freeze. Keep the container tightly closed. Get rid of any unused medication after the expiration date. This medication is flammable. Avoid exposure to heat, flame, and smoking. To get rid of medications that are no longer needed or have expired: Take the medication to a medication take-back program. Check with your pharmacy or law enforcement to find a location. If you cannot return the medication, ask your pharmacist or care team how to get rid of this medication safely. NOTE: This sheet is a summary. It may not cover all possible information. If you have questions about this medicine, talk to your doctor, pharmacist, or health care provider.  2023 [employer] (2022-01-25 00:00:00)

## 2022-11-04 ENCOUNTER — Telehealth: Payer: Self-pay | Admitting: Student

## 2022-11-04 ENCOUNTER — Telehealth: Payer: Self-pay | Admitting: Podiatry

## 2022-11-04 NOTE — Telephone Encounter (Signed)
patient are needing office notes from last visit

## 2022-11-06 ENCOUNTER — Telehealth: Payer: Self-pay | Admitting: Podiatry
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
## 2022-11-25 ENCOUNTER — Other Ambulatory Visit: Payer: Self-pay | Admitting: Podiatry

## 2022-11-25 DIAGNOSIS — M722 Plantar fascial fibromatosis: Secondary | ICD-10-CM

## 2022-11-25 DIAGNOSIS — B351 Tinea unguium: Secondary | ICD-10-CM

## 2022-11-25 DIAGNOSIS — M79672 Pain in left foot: Secondary | ICD-10-CM

## 2022-11-25 DIAGNOSIS — T148XXA Other injury of unspecified body region, initial encounter: Secondary | ICD-10-CM

## 2022-11-25 DIAGNOSIS — S92505A Nondisplaced unspecified fracture of left lesser toe(s), initial encounter for closed fracture: Secondary | ICD-10-CM

## 2023-02-04 ENCOUNTER — Ambulatory Visit (INDEPENDENT_AMBULATORY_CARE_PROVIDER_SITE_OTHER): Payer: BC Managed Care – PPO

## 2023-02-04 ENCOUNTER — Ambulatory Visit (INDEPENDENT_AMBULATORY_CARE_PROVIDER_SITE_OTHER): Payer: BC Managed Care – PPO | Admitting: Podiatry

## 2023-02-04 DIAGNOSIS — R52 Pain, unspecified: Secondary | ICD-10-CM | POA: Diagnosis not present

## 2023-02-04 DIAGNOSIS — M84375A Stress fracture, left foot, initial encounter for fracture: Secondary | ICD-10-CM

## 2023-02-04 DIAGNOSIS — R2242 Localized swelling, mass and lump, left lower limb: Secondary | ICD-10-CM

## 2023-02-04 MED ORDER — CICLOPIROX 8 % EX SOLN: Freq: Every day | CUTANEOUS | 0 refills | Status: AC

## 2023-02-04 MED ORDER — METHYLPREDNISOLONE 4 MG PO TBPK: ORAL_TABLET | ORAL | 0 refills | Status: DC

## 2023-02-04 NOTE — Progress Notes (Signed)
Subjective:   Patient ID: Brittney Riley, female   DOB: 52 y.o.   MRN: [id number]   HPI Chief Complaint  Patient presents with   Foot Problem    Foot and ankle pain in the right foot, pain started a few weeks ago all of a sudden, worse at night and radiates up leg. Pain located in the right midfoot and ankle      52 year old female presents the office today with above concerns.  She states that a few weeks ago she had been wearing she has had pain on the top and left leg for the last 3 weeks.  It does swell little bit.  She put ice on it last night.  No injuries that she reports.  No other concerns.   Review of Systems  All other systems reviewed and are negative.       Objective:  Physical Exam  General: AAO x3, NAD  Dermatological: Skin is warm, dry and supple bilateral.  There are no open sores, no preulcerative lesions, no rash or signs of infection present.  Vascular: Dorsalis Pedis artery and Posterior Tibial artery pedal pulses are 2/4 bilateral with immedate capillary fill time.  There is no pain with calf compression, swelling, warmth, erythema.  Calf is supple.  Neruologic: Grossly intact via light touch bilateral.   Musculoskeletal: On the right foot there is tenderness to palpation along the dorsal forefoot as well as midfoot and there is discomfort vibratory sensation compared to contralateral extremity.  There is edema present to the foot.  There is no erythema or warmth.  Flexor, extensor tendons appear to be intact.  MMT 5/5.  Gait: Unassisted, Nonantalgic.       Assessment:   Right foot pain, swelling, concern for stress fracture     Plan:  -Treatment options discussed including all alternatives, risks, and complications -Etiology of symptoms were discussed -X-rays were obtained and reviewed with the patient.  3 views of the foot and 2 views of the ankle were obtained.  No subacute fracture. -Given her discomfort and concern about stress fractures as  well.  I will place her into a short cam boot today for immobilization to help facilitate healing. -Medrol dose pack.  -Ice/elevation   Return in about 3 weeks (around 02/25/2023).  Trula Slade DPM

## 2023-02-25 ENCOUNTER — Ambulatory Visit: Payer: BC Managed Care – PPO | Admitting: Podiatry

## 2023-04-30 ENCOUNTER — Emergency Department (HOSPITAL_BASED_OUTPATIENT_CLINIC_OR_DEPARTMENT_OTHER)
Admission: EM | Admit: 2023-04-30 | Discharge: 2023-04-30 | Disposition: A | Discharge status: Home / Self Care | Payer: BC Managed Care – PPO | Attending: Emergency Medicine | Admitting: Emergency Medicine

## 2023-04-30 ENCOUNTER — Other Ambulatory Visit: Payer: Self-pay

## 2023-04-30 ENCOUNTER — Emergency Department (HOSPITAL_BASED_OUTPATIENT_CLINIC_OR_DEPARTMENT_OTHER): Payer: BC Managed Care – PPO

## 2023-04-30 ENCOUNTER — Encounter (HOSPITAL_BASED_OUTPATIENT_CLINIC_OR_DEPARTMENT_OTHER): Payer: Self-pay

## 2023-04-30 DIAGNOSIS — R1031 Right lower quadrant pain: Secondary | ICD-10-CM | POA: Diagnosis present

## 2023-04-30 DIAGNOSIS — K5792 Diverticulitis of intestine, part unspecified, without perforation or abscess without bleeding: Secondary | ICD-10-CM

## 2023-04-30 LAB — CBC WITH DIFFERENTIAL/PLATELET
Abs Immature Granulocytes: 0.03 10*3/uL (ref 0.00–0.07)
Basophils Absolute: 0 10*3/uL (ref 0.0–0.1)
Basophils Relative: 1 %
Eosinophils Absolute: 0.1 10*3/uL (ref 0.0–0.5)
Eosinophils Relative: 1 %
HCT: 38.8 % (ref 36.0–46.0)
Hemoglobin: 13.3 g/dL (ref 12.0–15.0)
Immature Granulocytes: 1 %
Lymphocytes Relative: 24 %
Lymphs Abs: 1.4 10*3/uL (ref 0.7–4.0)
MCH: 30.4 pg (ref 26.0–34.0)
MCHC: 34.3 g/dL (ref 30.0–36.0)
MCV: 88.8 fL (ref 80.0–100.0)
Monocytes Absolute: 0.3 10*3/uL (ref 0.1–1.0)
Monocytes Relative: 5 %
Neutro Abs: 4.2 10*3/uL (ref 1.7–7.7)
Neutrophils Relative %: 68 %
Platelets: 195 10*3/uL (ref 150–400)
RBC: 4.37 MIL/uL (ref 3.87–5.11)
RDW: 12.3 % (ref 11.5–15.5)
WBC: 6.1 10*3/uL (ref 4.0–10.5)
nRBC: 0 % (ref 0.0–0.2)

## 2023-04-30 LAB — COMPREHENSIVE METABOLIC PANEL
ALT: 42 U/L (ref 0–44)
AST: 19 U/L (ref 15–41)
Albumin: 4.3 g/dL (ref 3.5–5.0)
Alkaline Phosphatase: 92 U/L (ref 38–126)
Anion gap: 7 (ref 5–15)
BUN: 17 mg/dL (ref 6–20)
CO2: 28 mmol/L (ref 22–32)
Calcium: 9.4 mg/dL (ref 8.9–10.3)
Chloride: 105 mmol/L (ref 98–111)
Creatinine, Ser: 0.73 mg/dL (ref 0.44–1.00)
GFR, Estimated: >60 mL/min (ref >60)
Glucose, Bld: 88 mg/dL (ref 70–99)
Potassium: 3.8 mmol/L (ref 3.5–5.1)
Sodium: 140 mmol/L (ref 135–145)
Total Bilirubin: 0.5 mg/dL (ref 0.3–1.2)
Total Protein: 7.1 g/dL (ref 6.5–8.1)

## 2023-04-30 LAB — URINALYSIS, ROUTINE W REFLEX MICROSCOPIC
Bilirubin Urine: NEGATIVE
Glucose, UA: NEGATIVE mg/dL
Ketones, ur: NEGATIVE mg/dL
Leukocytes,Ua: NEGATIVE
Nitrite: NEGATIVE
Protein, ur: NEGATIVE mg/dL
Specific Gravity, Urine: 1.02 (ref 1.005–1.030)
pH: 5.5 (ref 5.0–8.0)

## 2023-04-30 LAB — LIPASE, BLOOD: Lipase: 24 U/L (ref 11–51)

## 2023-04-30 MED ORDER — AMOXICILLIN-POT CLAVULANATE 875-125 MG PO TABS: 1.0000 | ORAL_TABLET | Freq: Two times a day (BID) | ORAL | 0 refills | Status: AC

## 2023-04-30 MED ORDER — IOHEXOL 300 MG/ML  SOLN
100.0000 mL | Freq: Once | INTRAMUSCULAR | Status: AC | PRN
  Administered 2023-04-30: 80 mL via INTRAVENOUS

## 2023-04-30 NOTE — ED Provider Notes (Signed)
[place] EMERGENCY DEPARTMENT AT [facility] Provider Note   CSN: [id number] Arrival date & time: 04/30/23  1101     History  Chief Complaint  Patient presents with   Abdominal Pain    Brittney Riley is a 52 y.o. female.  Patient is a 52 year old female with a history of GERD status post C-sections, abdominal hysterectomy, appendectomy and ovarian cyst removal who gets colonoscopies every 5 years due to family history last colonoscopy was clean who is presenting today with 4 to 5 days of worsening abdominal pain.  Started in the right lower abdomen but now after eating sometimes radiates to the left side.  She has had no nausea vomiting and did not notice until yesterday that eating made it worse.  Movement or touching it is the only thing that makes it worse.  If she lies still she feels better.  No issues with urinating.  No diarrhea, constipation, or history of frequent abdominal pain.  The history is provided by the patient.  Abdominal Pain      Home Medications Prior to Admission medications   Medication Sig Start Date End Date Taking? Authorizing Provider  amoxicillin-clavulanate (AUGMENTIN) 875-125 MG tablet Take 1 tablet by mouth every 12 (twelve) hours. 04/30/23  Yes Andilyn Bettcher, Alphonzo Lemmings, MD  ciclopirox (PENLAC) 8 % solution Apply topically at bedtime. Apply over nail and surrounding skin. Apply daily over previous coat. After seven (7) days, may remove with alcohol and continue cycle. 02/04/23   Vivi Barrack, DPM  cyclobenzaprine (FLEXERIL) 10 MG tablet Take 0.5 tablets (5 mg total) by mouth 3 (three) times daily as needed for muscle spasms. 08/14/22   Small, Brooke L, PA  diazepam (VALIUM) 5 MG tablet Take 1 tab 30 min prior to procedure, may take a second tab if needed 05/01/22   Marcos Eke, PA-C  Efinaconazole 10 % SOLN Apply 1 drop topically daily. 11/03/22   Vivi Barrack, DPM  ketorolac (TORADOL) 10 MG tablet Take 1 tablet (10 mg total) by mouth  every 6 (six) hours as needed. 08/14/22   Small, Brooke L, PA  methylPREDNISolone (MEDROL DOSEPAK) 4 MG TBPK tablet Take as directed 02/04/23   Vivi Barrack, DPM  rosuvastatin (CRESTOR) 20 MG tablet Take 20 mg by mouth every evening. 02/23/22   [provider]      Allergies    Doxycycline, Levofloxacin, and Meclizine    Review of Systems   Review of Systems  Gastrointestinal:  Positive for abdominal pain.    Physical Exam Updated Vital Signs BP 117/77 (BP Location: Right Arm)   Pulse 71   Temp 98.2 F (36.8 C) (Oral)   Resp 15   Ht 5\' 6"  (1.676 m)   Wt 80.7 kg   SpO2 99%   BMI 28.73 kg/m  Physical Exam Vitals and nursing note reviewed.  Constitutional:      General: She is not in acute distress.    Appearance: She is well-developed.  HENT:     Head: Normocephalic and atraumatic.  Eyes:     Pupils: Pupils are equal, round, and reactive to light.  Cardiovascular:     Rate and Rhythm: Normal rate and regular rhythm.     Heart sounds: Normal heart sounds. No murmur heard.    No friction rub.  Pulmonary:     Effort: Pulmonary effort is normal.     Breath sounds: Normal breath sounds. No wheezing or rales.  Abdominal:     General: Bowel sounds  are normal. There is no distension.     Palpations: Abdomen is soft.     Tenderness: There is abdominal tenderness in the right lower quadrant. There is guarding and rebound. There is no right CVA tenderness or left CVA tenderness.  Musculoskeletal:        General: No tenderness. Normal range of motion.     Comments: No edema  Skin:    General: Skin is warm and dry.     Findings: No rash.  Neurological:     Mental Status: She is alert and oriented to person, place, and time.     Cranial Nerves: No cranial nerve deficit.  Psychiatric:        Behavior: Behavior normal.     ED Results / Procedures / Treatments   Labs (all labs ordered are listed, but only abnormal results are displayed) Labs Reviewed   URINALYSIS, ROUTINE W REFLEX MICROSCOPIC - Abnormal; Notable for the following components:      Result Value   Hgb urine dipstick SMALL (*)    Bacteria, UA RARE (*)    All other components within normal limits  CBC WITH DIFFERENTIAL/PLATELET  COMPREHENSIVE METABOLIC PANEL  LIPASE, BLOOD    EKG None  Radiology CT ABDOMEN PELVIS W CONTRAST  Result Date: 04/30/2023 CLINICAL DATA:  Abdominal pain for 4 days. EXAM: CT ABDOMEN AND PELVIS WITH CONTRAST TECHNIQUE: Multidetector CT imaging of the abdomen and pelvis was performed using the standard protocol following bolus administration of intravenous contrast. RADIATION DOSE REDUCTION: This exam was performed according to the departmental dose-optimization program which includes automated exposure control, adjustment of the mA and/or kV according to patient size and/or use of iterative reconstruction technique. CONTRAST:  80mL OMNIPAQUE IOHEXOL 300 MG/ML  SOLN COMPARISON:  MRI abdomen 07/12/2021. FINDINGS: Lower chest: There is some linear opacity seen along bases likely scar or atelectasis. Trace pericardial fluid. Subpleural 3 mm nodule seen lateral left lower lobe on series 4, image 7. No follow-up needed if patient is low-risk.This recommendation follows the consensus statement: Guidelines for Management of Incidental Pulmonary Nodules Detected on CT Images: From the Fleischner Society 2017; Radiology 2017; 284:228-243. Hepatobiliary: No focal liver abnormality is seen. No gallstones, gallbladder wall thickening, or biliary dilatation. Patent portal vein. Pancreas: Unremarkable. No pancreatic ductal dilatation or surrounding inflammatory changes. Spleen: Normal in size without focal abnormality.  Small splenule. Adrenals/Urinary Tract: Adrenal glands are unremarkable. Kidneys are normal, without renal calculi, focal lesion, or hydronephrosis. Bladder is unremarkable. Stomach/Bowel: No oral contrast. There is mild-to-moderate debris in the nondilated  stomach. Small bowel is nondilated. Large bowel has a normal course and caliber. Moderate colonic stool overall. Few left-sided greater than right-sided diverticula. However there is significant wall thickening along the base of the cecum with stranding and some adjacent reactive nodes. Based on appearance this could represent a focal right-sided diverticulitis. However recommend follow-up to confirm resolution and exclude secondary pathology. No obstruction, free air or free fluid. No abscess formation. Of note the appendix is not seen in the right lower quadrant. Vascular/Lymphatic: Scattered vascular calcifications. Normal caliber aorta and IVC. No discrete abnormal lymph node enlargement identified. Small reactive appearing nodes in the right lower quadrant mesentery. Reproductive: Status post hysterectomy. No adnexal masses. Other: No free air or free fluid. Musculoskeletal: No acute or significant osseous findings. IMPRESSION: Wall thickening and stranding in the area of the cecum with some adjacent reactive nodes. Based on appearance favor this being an area of diverticulitis or other focal colitis. The appendix  is not clearly identified at this time. Recommend follow up imaging after treatment to confirm resolution and exclude secondary pathology. No obstruction, free air or fluid collection Electronically Signed   By: Karen Kays M.D.   On: 04/30/2023 14:16    Procedures Procedures    Medications Ordered in ED Medications  iohexol (OMNIPAQUE) 300 MG/ML solution 100 mL (80 mLs Intravenous Contrast Given 04/30/23 1321)    ED Course/ Medical Decision Making/ A&P                             Medical Decision Making Amount and/or Complexity of Data Reviewed Labs: ordered. Decision-making details documented in ED Course. Radiology: ordered and independent interpretation performed. Decision-making details documented in ED Course.  Risk Prescription drug management.   Pt presenting today with  a complaint that caries a high risk for morbidity and mortality.  Here today with abdominal pain.  Patient does have right lower quadrant pain with some rebound and guarding.  Concern for possible atypical diverticulitis, kidney stone, ovarian pathology.  Patient does not not have an appendix and has no pain in the right upper quadrant to suggest gallbladder or liver pathology.  Lower suspicion for pyelonephritis or UTI based on patient's symptoms.  Patient refusing all medications at this time.  Labs and imaging pending.  3:07 PM I independently interpreted patient's labs and CBC, CMP and UA all without acute findings.  I have independently visualized and interpreted pt's images today.  CT today without evidence of renal stones or hydronephrosis, radiology reports wall thickening and stranding in the area of the cecum with some adjacent reactive lymph nodes appears to be diverticulitis or focal colitis.  Patient has had an appendectomy in the past.  Findings discussed with the patient.  She is still able to tolerate p.o.'s refusing any pain medication but is otherwise well-appearing with normal vital signs.  Will treat with Augmentin, given return precautions and follow-up with her GI and PCP.  She is comfortable with this plan.  No social barriers affecting her discharge.          Final Clinical Impression(s) / ED Diagnoses Final diagnoses:  Diverticulitis    Rx / DC Orders ED Discharge Orders          Ordered    amoxicillin-clavulanate (AUGMENTIN) 875-125 MG tablet  Every 12 hours        04/30/23 1458              Gwyneth Sprout, MD 04/30/23 [phone]

## 2023-04-30 NOTE — Discharge Instructions (Addendum)
If you start having fever, vomiting, worsening pain even with antibiotic return to the emergency room.  If you do not get loose stool from the antibiotics start taking a stool softener so you are having regular bowel movements.

## 2023-04-30 NOTE — ED Triage Notes (Signed)
"  Abdominal pain x 4 days, seen at urgent care and they recommended I come here for a CT scan" per pt  Denies n/v/d. Denies dysuria or any known injuries

## 2024-02-22 IMAGING — MR MR THORACIC SPINE WO/W CM
5 of 9 series · 25 of 48 positions shown · IV contrast (15ml Multihance)
Comparison: Comparison made with prior brain MRI from 04/03/2022.

CLINICAL DATA: Initial evaluation for multiple sclerosis. White
matter signal abnormality on prior brain MRI.

EXAM:
MRI THORACIC WITHOUT AND WITH CONTRAST
TECHNIQUE: Multiplanar and multiecho pulse sequences of the thoracic spine were
obtained without and with intravenous contrast.
CONTRAST:  15mL MULTIHANCE GADOBENATE DIMEGLUMINE 529 MG/ML IV SOLN

[Series 2: T1 · sagittal · 4.0mm · 1.25mm/px · 4 of 13 slices shown (1 of 3)]
[im 1/13]
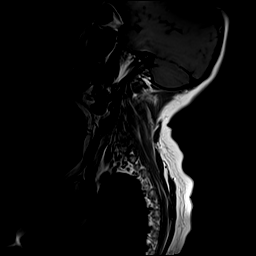
[im 5/13]
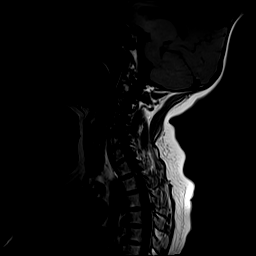
[im 9/13]
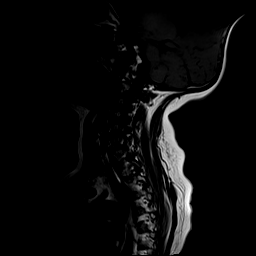
[im 13/13]
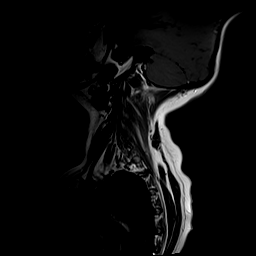

[Series 5: T1 · sagittal · 3.0mm · 0.52mm/px · 4 of 20 slices shown (2 of 3)]
[im 1/20]
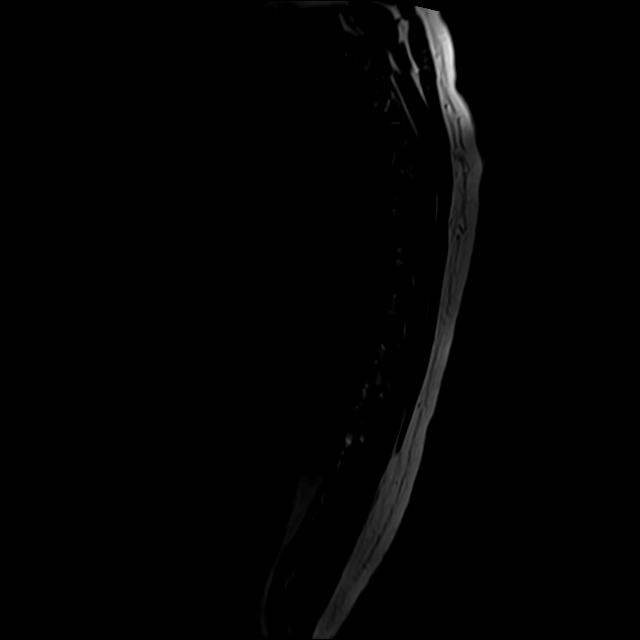
[im 7/20]
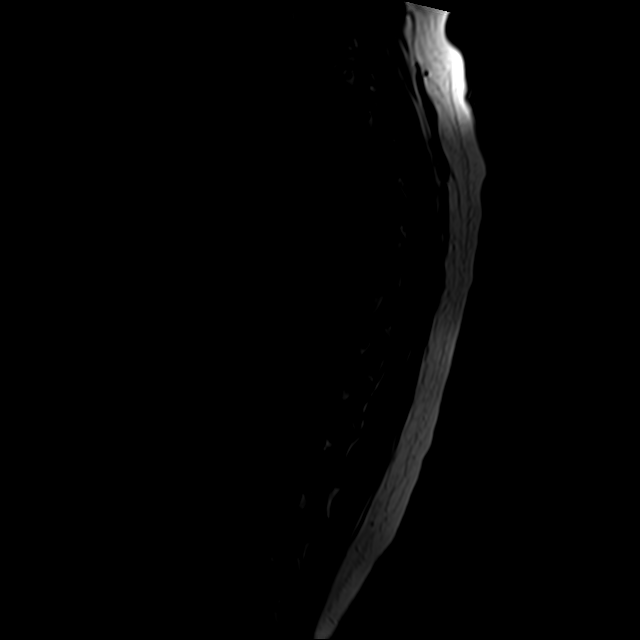
[im 13/20]
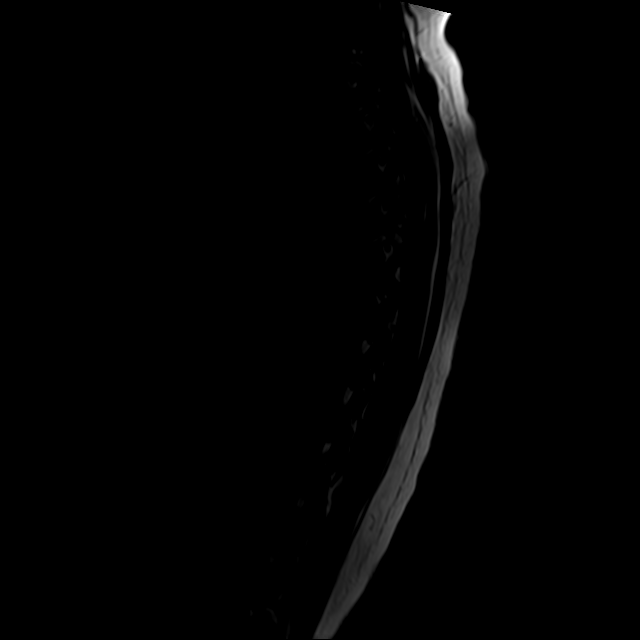
[im 20/20]
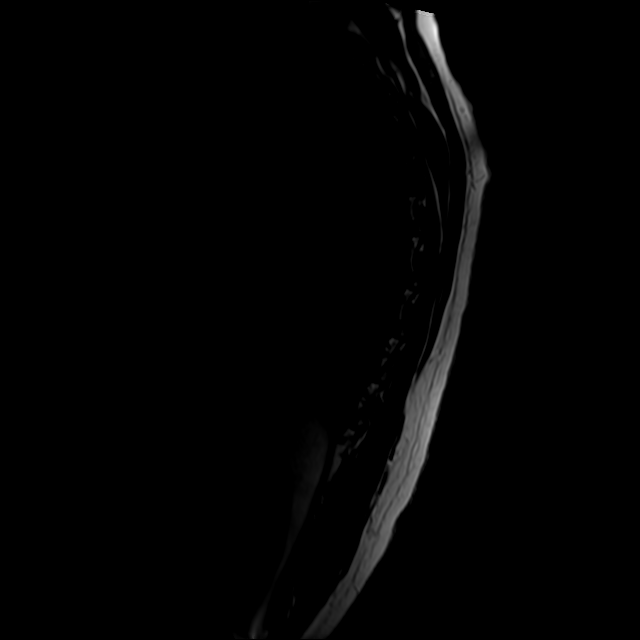

[Series 6: T2 · axial · 4.0mm · 0.39mm/px · z∈[-325,-122]mm · 7 of 36 slices shown]
[im 1/36]
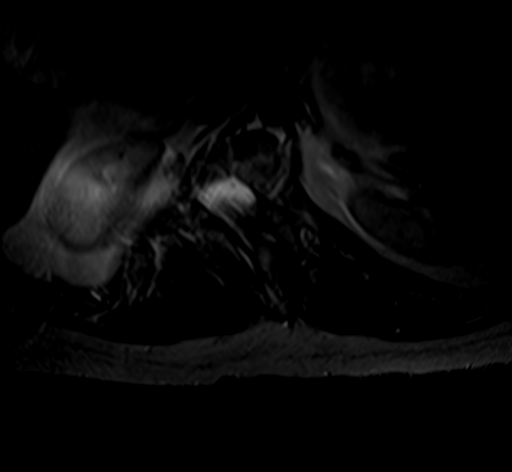
[im 6/36]
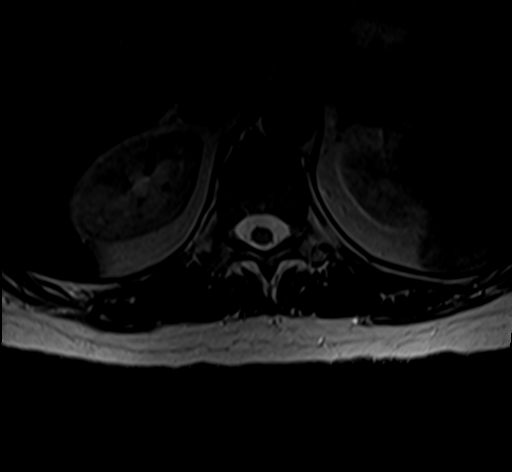
[im 12/36]
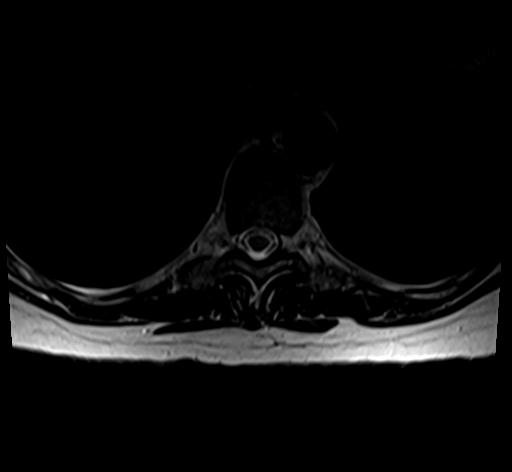
[im 18/36]
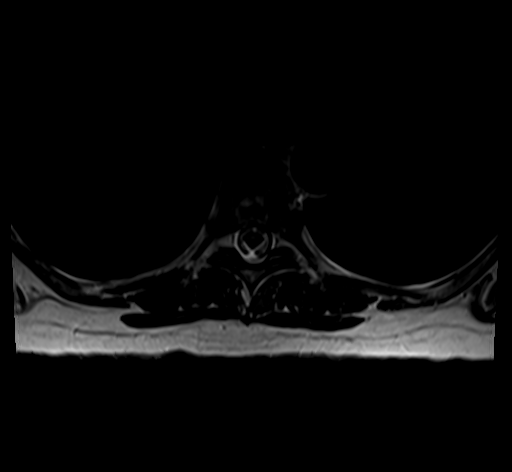
[im 24/36]
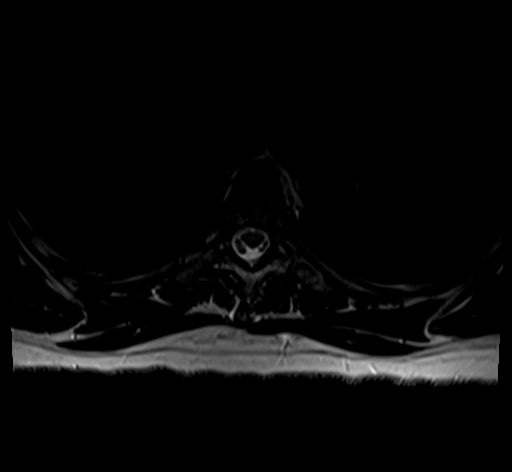
[im 30/36]
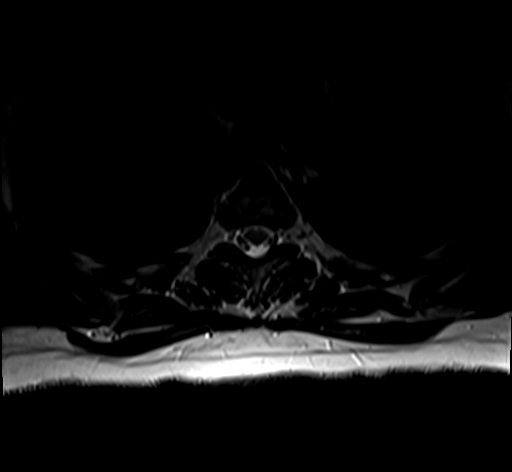
[im 36/36]
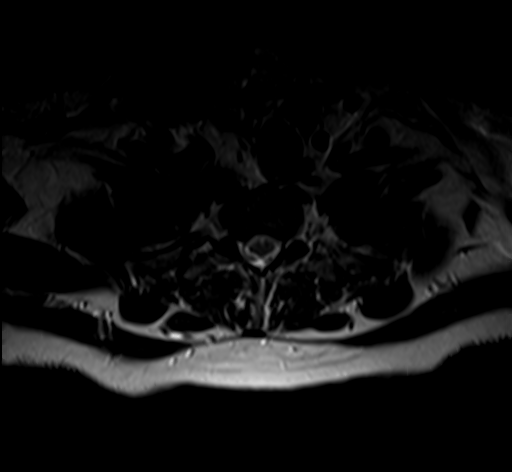

[Series 8: T1 · axial · non-contrast · 4.0mm · 0.78mm/px · z∈[-326,-144]mm · 6 of 36 slices shown (3 of 3)]
[im 1/36]
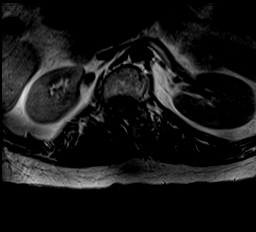
[im 6/36]
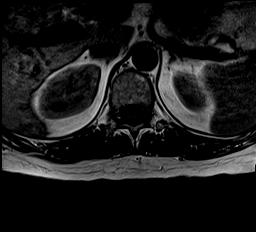
[im 12/36]
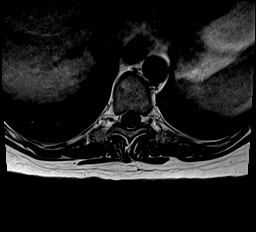
[im 18/36]
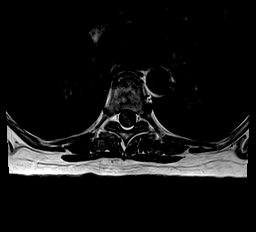
[im 24/36]
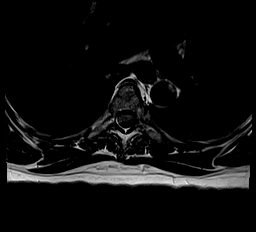
[im 30/36]
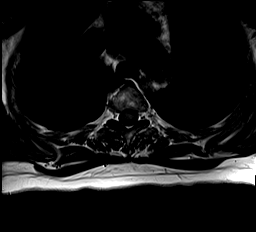

[Series 9: T2 post-contrast · sagittal · 3.0mm · 0.64mm/px · 4 of 20 slices shown]
[im 1/20]
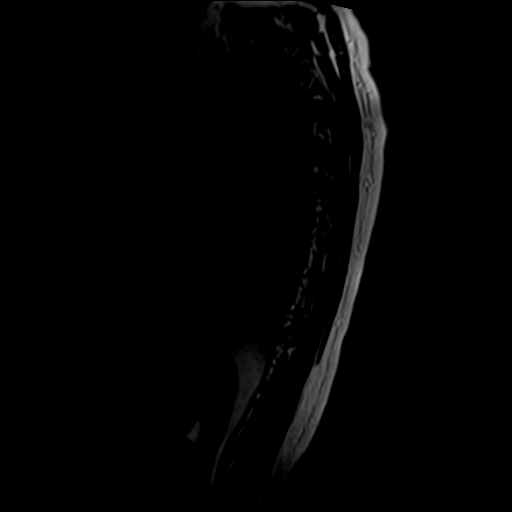
[im 7/20]
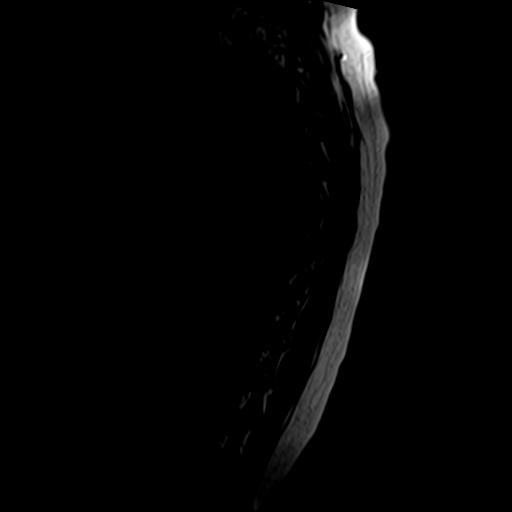
[im 13/20]
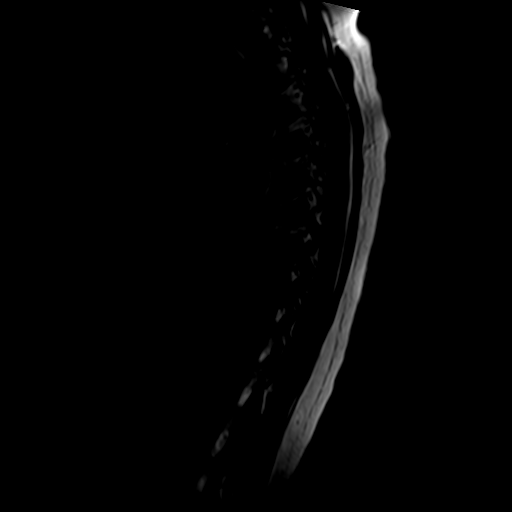
[im 20/20]
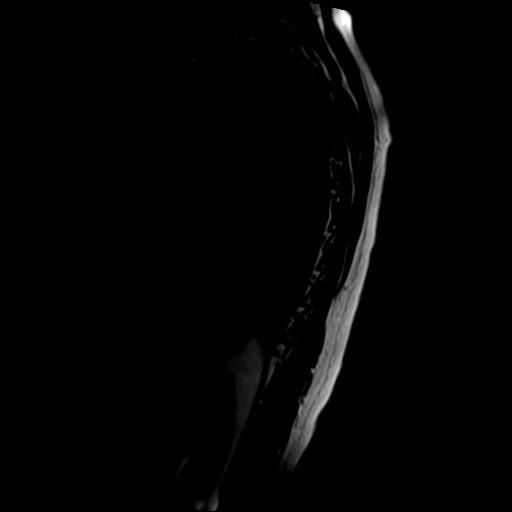

[25 of 48 positions shown; findings below may reference images not displayed]

FINDINGS: Alignment: Mild exaggeration of the normal thoracic kyphosis. No
listhesis.

Vertebrae: Vertebral body height maintained without acute or chronic
fracture. Bone marrow signal intensity within normal limits. Small
benign hemangioma noted within the T12 vertebral body. No other
discrete or worrisome osseous lesions. No abnormal marrow edema or
enhancement.

Cord: Normal signal and morphology. No cord signal changes to
suggest demyelinating disease. No abnormal enhancement.

Paraspinal and other soft tissues: Paraspinous soft tissues within
normal limits. Trace layering bilateral pleural effusions noted.
Subcentimeter simple cyst noted within the left kidney, benign in
appearance, no follow-up imaging recommended. Visualized visceral
structures otherwise unremarkable.

Disc levels:

No significant disc pathology seen within the thoracic spine for
age. No disc bulge or focal disc herniation. No stenosis or
impingement.
IMPRESSION: 1. Normal MRI appearance of the thoracic spinal cord. No cord signal
changes to suggest demyelinating disease. No abnormal enhancement.
2. No significant disc pathology, stenosis, or evidence for neural
impingement.

## 2024-06-24 ENCOUNTER — Other Ambulatory Visit: Payer: Self-pay

## 2024-06-24 DIAGNOSIS — R2231 Localized swelling, mass and lump, right upper limb: Secondary | ICD-10-CM | POA: Diagnosis present

## 2024-06-24 DIAGNOSIS — T63481A Toxic effect of venom of other arthropod, accidental (unintentional), initial encounter: Secondary | ICD-10-CM | POA: Insufficient documentation

## 2024-06-24 NOTE — ED Triage Notes (Signed)
 Pt POV reporting increased R hand swelling after being stung/bit by unknown insect around 7am. Pt stepped out on porch and insect dropped on head, swatted with R hand.

## 2024-06-25 ENCOUNTER — Emergency Department (HOSPITAL_BASED_OUTPATIENT_CLINIC_OR_DEPARTMENT_OTHER)
Admission: EM | Admit: 2024-06-25 | Discharge: 2024-06-25 | Disposition: A | Discharge status: Home / Self Care | Attending: Emergency Medicine | Admitting: Emergency Medicine

## 2024-06-25 DIAGNOSIS — T63481A Toxic effect of venom of other arthropod, accidental (unintentional), initial encounter: Secondary | ICD-10-CM

## 2024-06-25 MED ORDER — PREDNISONE 10 MG (21) PO TBPK: ORAL_TABLET | ORAL | 0 refills | Status: AC

## 2024-06-25 MED ORDER — PREDNISONE 50 MG PO TABS
60.0000 mg | ORAL_TABLET | Freq: Once | ORAL | Status: AC
  Administered 2024-06-25: 60 mg via ORAL
  Filled 2024-06-25: qty 1

## 2024-06-25 NOTE — ED Notes (Signed)
 Dc instructions given, pt verbalized understanding. Out of ED with steady gait.

## 2024-06-25 NOTE — ED Provider Notes (Signed)
 [place] EMERGENCY DEPARTMENT AT [facility]  Provider Note  CSN: [id number] Arrival date & time: 06/24/24 2123  History Chief Complaint  Patient presents with   Insect Bite    Brittney Riley is a 53 y.o. female reports around 7am on the morning of arrival she walked onto her front porch (where there is a known wasp nest) and felt something buzzing near her ear. She swatted with her R hand and then felt a sharp pain. She had some swelling that improved initially with benadryl and ice but has gotten worse as the day has gone on. No fever. She is on Amoxil  for recent tick bite but has not had any symptoms of tick borne illness.    Home Medications Prior to Admission medications   Medication Sig Start Date End Date Taking? Authorizing Provider  predniSONE  (STERAPRED UNI-PAK 21 TAB) 10 MG (21) TBPK tablet 10mg  Tabs, 6 day taper. Use as directed 06/25/24  Yes Roselyn Carlin NOVAK, MD  amoxicillin -clavulanate (AUGMENTIN ) 875-125 MG tablet Take 1 tablet by mouth every 12 (twelve) hours. 04/30/23   Doretha Folks, MD  ciclopirox  (PENLAC ) 8 % solution Apply topically at bedtime. Apply over nail and surrounding skin. Apply daily over previous coat. After seven (7) days, may remove with alcohol and continue cycle. 02/04/23   Gershon Donnice SAUNDERS, DPM  cyclobenzaprine  (FLEXERIL ) 10 MG tablet Take 0.5 tablets (5 mg total) by mouth 3 (three) times daily as needed for muscle spasms. 08/14/22   Small, Brooke L, PA  diazepam  (VALIUM ) 5 MG tablet Take 1 tab 30 min prior to procedure, may take a second tab if needed 05/01/22   Wertman, Sara E, PA-C  Efinaconazole  10 % SOLN Apply 1 drop topically daily. 11/03/22   Gershon Donnice SAUNDERS, DPM  ketorolac  (TORADOL ) 10 MG tablet Take 1 tablet (10 mg total) by mouth every 6 (six) hours as needed. 08/14/22   Small, Brooke L, PA  rosuvastatin (CRESTOR) 20 MG tablet Take 20 mg by mouth every evening. 02/23/22   [provider]     Allergies     Doxycycline, Levofloxacin , and Meclizine   Review of Systems   Review of Systems Please see HPI for pertinent positives and negatives  Physical Exam BP (!) 130/90   Pulse 62   Temp 98.2 F (36.8 C) (Oral)   Resp 16   Ht 5' 6 (1.676 m)   Wt 78.9 kg   SpO2 100%   BMI 28.08 kg/m   Physical Exam Vitals and nursing note reviewed.  HENT:     Head: Normocephalic.     Nose: Nose normal.  Eyes:     Extraocular Movements: Extraocular movements intact.  Pulmonary:     Effort: Pulmonary effort is normal.  Musculoskeletal:        General: Swelling (R hand) and tenderness present. Normal range of motion.     Cervical back: Neck supple.     Comments: No sting or bite marks  Skin:    Findings: No rash (on exposed skin).  Neurological:     Mental Status: She is alert and oriented to person, place, and time.  Psychiatric:        Mood and Affect: Mood normal.     ED Results / Procedures / Treatments   EKG None  Procedures Procedures  Medications Ordered in the ED Medications  predniSONE  (DELTASONE ) tablet 60 mg (has no administration in time range)    Initial Impression and Plan  Patient here with localized  reaction to insect sting, given worsening swelling will give a course of oral steroids. Recommend she continue benadryl, ice and elevation. Advised future stings could have increasingly severe reactions. PCP follow up, RTED for any other concerns.    ED Course       MDM Rules/Calculators/A&P Medical Decision Making Problems Addressed: Insect stings, accidental or unintentional, initial encounter: acute illness or injury  Risk Prescription drug management.     Final Clinical Impression(s) / ED Diagnoses Final diagnoses:  Insect stings, accidental or unintentional, initial encounter    Rx / DC Orders ED Discharge Orders          Ordered    predniSONE  (STERAPRED UNI-PAK 21 TAB) 10 MG (21) TBPK tablet        06/25/24 0143             Roselyn Carlin NOVAK, MD 06/25/24 [phone]

## 2024-12-08 ENCOUNTER — Encounter (HOSPITAL_BASED_OUTPATIENT_CLINIC_OR_DEPARTMENT_OTHER): Payer: Self-pay | Admitting: *Deleted

## 2024-12-08 ENCOUNTER — Other Ambulatory Visit (HOSPITAL_BASED_OUTPATIENT_CLINIC_OR_DEPARTMENT_OTHER): Payer: Self-pay | Admitting: *Deleted

## 2024-12-08 DIAGNOSIS — R748 Abnormal levels of other serum enzymes: Secondary | ICD-10-CM

## 2024-12-08 DIAGNOSIS — R1010 Upper abdominal pain, unspecified: Secondary | ICD-10-CM

## 2024-12-08 DIAGNOSIS — R319 Hematuria, unspecified: Secondary | ICD-10-CM

## 2024-12-11 ENCOUNTER — Ambulatory Visit (HOSPITAL_BASED_OUTPATIENT_CLINIC_OR_DEPARTMENT_OTHER)
Admission: RE | Admit: 2024-12-11 | Discharge: 2024-12-11 | Disposition: A | Discharge status: Home / Self Care | Source: Ambulatory Visit | Attending: *Deleted | Admitting: *Deleted

## 2024-12-11 DIAGNOSIS — R1010 Upper abdominal pain, unspecified: Secondary | ICD-10-CM | POA: Diagnosis present

## 2024-12-11 DIAGNOSIS — R319 Hematuria, unspecified: Secondary | ICD-10-CM | POA: Diagnosis present

## 2024-12-11 DIAGNOSIS — R748 Abnormal levels of other serum enzymes: Secondary | ICD-10-CM | POA: Insufficient documentation

## 2025-01-09 ENCOUNTER — Ambulatory Visit: Admitting: Gastroenterology
